# Patient Record
Sex: Male | Born: 1970
Health system: Southern US, Community
[De-identification: ages and names within clinical notes are randomized; demographics above are authoritative.]

## PROBLEM LIST (undated history)

## (undated) DIAGNOSIS — R569 Unspecified convulsions: Secondary | ICD-10-CM

## (undated) DIAGNOSIS — E559 Vitamin D deficiency, unspecified: Secondary | ICD-10-CM

## (undated) DIAGNOSIS — E538 Deficiency of other specified B group vitamins: Secondary | ICD-10-CM

## (undated) DIAGNOSIS — N189 Chronic kidney disease, unspecified: Secondary | ICD-10-CM

## (undated) DIAGNOSIS — K219 Gastro-esophageal reflux disease without esophagitis: Secondary | ICD-10-CM

## (undated) DIAGNOSIS — Z87442 Personal history of urinary calculi: Secondary | ICD-10-CM

## (undated) HISTORY — DX: Deficiency of other specified B group vitamins: E53.8

## (undated) HISTORY — DX: Vitamin D deficiency, unspecified: E55.9

## (undated) HISTORY — DX: Unspecified convulsions: R56.9

## (undated) HISTORY — PX: HERNIA REPAIR: SHX51

## (undated) HISTORY — PX: OTHER SURGICAL HISTORY: SHX169

## (undated) HISTORY — DX: Gastro-esophageal reflux disease without esophagitis: K21.9

## (undated) HISTORY — DX: Chronic kidney disease, unspecified: N18.9

---

## 1992-07-15 HISTORY — PX: APPENDECTOMY: SHX54

## 2012-06-23 ENCOUNTER — Encounter: Payer: Self-pay | Admitting: Sports Medicine

## 2012-06-23 ENCOUNTER — Ambulatory Visit (INDEPENDENT_AMBULATORY_CARE_PROVIDER_SITE_OTHER): Payer: BC Managed Care – PPO | Admitting: Sports Medicine

## 2012-06-23 VITALS — BP 149/79 | HR 80

## 2012-06-23 DIAGNOSIS — M7660 Achilles tendinitis, unspecified leg: Secondary | ICD-10-CM

## 2012-06-23 DIAGNOSIS — Q667 Congenital pes cavus, unspecified foot: Secondary | ICD-10-CM | POA: Insufficient documentation

## 2012-06-23 DIAGNOSIS — M216X9 Other acquired deformities of unspecified foot: Secondary | ICD-10-CM

## 2012-06-23 DIAGNOSIS — M7661 Achilles tendinitis, right leg: Secondary | ICD-10-CM | POA: Insufficient documentation

## 2012-06-23 NOTE — Progress Notes (Signed)
Patient ID: Devon Carter, male   DOB: 05-Apr-1971, 41 y.o.   MRN: [id number]  Patient was referred to me courtesy of Launa Flight He works as a Psychologist, occupational at the [employer] He does running for fitness and does recreational basketball He used to play more competitive basketball but still enjoys playing several times a week  Last year he developed a right Achilles tendinitis at the insertion This took about 9 months to resolve He used Dr. Margart Sickles insoles with a lift in his shoes but those are uncomfortable on his arch That did help the Achilles tendon  History a remote fracture to his left ankle This has never had quite as good of motion since that time and he feels he pushes off harder with the right side  Physical examination  No acute distress  Bilateral cavus feet  RT Ankle: No visible erythema or swelling. Range of motion is full in all directions. Strength is 5/5 in all directions. Stable lateral and medial ligaments; squeeze test and kleiger test unremarkable; Talar dome nontender; No pain at base of 5th MT; No tenderness over cuboid; No tenderness over N spot or navicular prominence No tenderness on posterior aspects of lateral and medial malleolus No sign of peroneal tendon subluxations; Negative tarsal tunnel tinel's Able to walk 4 steps.  LT ankle Stable in all testing just as is the right However his motion is decreased by about 10-15% on the left There is some thickening of the ankle capsule on the left  Mild tenderness to direct palpation just at the insertion of the right Achilles but no swelling or redness  Running gait reveals that he hits with high impact with more pressure on his forefoot and overall neutral gait

## 2012-06-23 NOTE — Assessment & Plan Note (Signed)
Patient was fitted for a : standard, cushioned, semi-rigid orthotic. The orthotic was heated and afterward the patient stood on the orthotic blank positioned on the orthotic stand. The patient was positioned in subtalar neutral position and 10 degrees of ankle dorsiflexion in a weight bearing stance. After completion of molding, a stable base was applied to the orthotic blank. The blank was ground to a stable position for weight bearing. Size: 11 red EVA Base: blue EVA Posting: Lt first ray Additional orthotic padding:none Time 43 mins  His running gait was definitely improved in the orthotics and he feels both more cushioned and less pressure on his right Achilles tendon  He should use these for the next several months and we will be happy adjustment if needed   Recheck with me if this does not resolve his symptoms

## 2012-06-23 NOTE — Assessment & Plan Note (Signed)
Given a standard set of Achilles rehabilitation exercises  He needs to give these a good probably do them at least 3 times weekly for the next several months

## 2012-09-11 ENCOUNTER — Ambulatory Visit: Payer: BC Managed Care – PPO | Admitting: Family Medicine

## 2012-09-22 ENCOUNTER — Encounter: Payer: Self-pay | Admitting: Family Medicine

## 2012-09-22 ENCOUNTER — Ambulatory Visit (INDEPENDENT_AMBULATORY_CARE_PROVIDER_SITE_OTHER): Payer: BC Managed Care – PPO | Admitting: Family Medicine

## 2012-09-22 VITALS — BP 120/80 | HR 72 | Temp 98.3°F | Resp 12 | Ht 71.0 in | Wt 184.0 lb

## 2012-09-22 DIAGNOSIS — E559 Vitamin D deficiency, unspecified: Secondary | ICD-10-CM | POA: Insufficient documentation

## 2012-09-22 DIAGNOSIS — K219 Gastro-esophageal reflux disease without esophagitis: Secondary | ICD-10-CM | POA: Insufficient documentation

## 2012-09-22 DIAGNOSIS — Z87442 Personal history of urinary calculi: Secondary | ICD-10-CM

## 2012-09-22 DIAGNOSIS — Z8669 Personal history of other diseases of the nervous system and sense organs: Secondary | ICD-10-CM

## 2012-09-22 DIAGNOSIS — E538 Deficiency of other specified B group vitamins: Secondary | ICD-10-CM

## 2012-09-22 DIAGNOSIS — Z0000 Encounter for general adult medical examination without abnormal findings: Secondary | ICD-10-CM

## 2012-09-22 NOTE — Progress Notes (Signed)
  Subjective:    Patient ID: Devon Carter, male    DOB: Jun 01, 1971, 42 y.o.   MRN: [id number]  HPI Patient seen to establish care.  Moved here from [facility] secondary to work Major medical issue is recurrent kidney stones These were calcium stones and he estimates he had about 19. No recent stones. These have generally past with pain medications and increased hydration  History of grand mal seizures age 71. He was treated with Dilantin until freshman in college and stopped this on his own. He has not had any seizures since stopping as many years ago. Other medical problems include history of GERD and he takes regular AcipHex. He had previous endoscopies which have been normal History of low vitamin B12 apparently related to PPI use. Not a vegetarian. No history of gastric surgery History of reported low vitamin D on replacement. Also takes oral vitamin B12 replacement  Family history significant for father having prostate cancer and died of complications of esophageal cancer. Father also reportedly had congestive heart failure.  Patient is married. He has 2 children. Nonsmoker. Occasional alcohol use. Works in Photographer   Review of Systems  Constitutional: Negative for appetite change, fatigue and unexpected weight change.  Eyes: Negative for visual disturbance.  Respiratory: Negative for cough, chest tightness and shortness of breath.   Cardiovascular: Negative for chest pain, palpitations and leg swelling.  Neurological: Negative for dizziness, syncope, weakness, light-headedness and headaches.       Objective:   Physical Exam  Constitutional: He appears well-developed and well-nourished.  Neck: Neck supple. No thyromegaly present.  Cardiovascular: Normal rate and regular rhythm.   Pulmonary/Chest: Effort normal and breath sounds normal. No respiratory distress. He has no wheezes. He has no rales.  Musculoskeletal: He exhibits no edema.          Assessment  & Plan:  #1 history of recurrent kidney stones. Patient has hydrocodone to use as needed. Consider establishing with local urologist if he has ongoing stone issues #2 history of chronic GERD. Stable on AcipHex  #3 history of low vitamin B12. ?secondary to chronic PPI use. Recheck levels with physical which is scheduled #4 history of low vitamin D. Recheck levels at followup #5 remote history of grand mal seizures but no seizure in several years off medication

## 2012-10-08 ENCOUNTER — Other Ambulatory Visit (INDEPENDENT_AMBULATORY_CARE_PROVIDER_SITE_OTHER): Payer: BC Managed Care – PPO

## 2012-10-08 DIAGNOSIS — Z0000 Encounter for general adult medical examination without abnormal findings: Secondary | ICD-10-CM

## 2012-10-08 LAB — BASIC METABOLIC PANEL
GFR: 63.19 mL/min (ref >60.00)
Glucose, Bld: 96 mg/dL (ref 70–99)
Potassium: 5.2 mEq/L — ABNORMAL HIGH (ref 3.5–5.1)
Sodium: 138 mEq/L (ref 135–145)

## 2012-10-08 LAB — CBC WITH DIFFERENTIAL/PLATELET
Eosinophils Absolute: 0.2 10*3/uL (ref 0.0–0.7)
MCHC: 34.1 g/dL (ref 30.0–36.0)
MCV: 90.8 fl (ref 78.0–100.0)
Monocytes Absolute: 0.7 10*3/uL (ref 0.1–1.0)
Neutrophils Relative %: 67.3 % (ref 43.0–77.0)
Platelets: 173 10*3/uL (ref 150.0–400.0)

## 2012-10-08 LAB — LIPID PANEL
Cholesterol: 177 mg/dL (ref 0–200)
HDL: 46.8 mg/dL (ref >39.00)
Triglycerides: 67 mg/dL (ref 0.0–149.0)
VLDL: 13.4 mg/dL (ref 0.0–40.0)

## 2012-10-08 LAB — VITAMIN B12: Vitamin B-12: 361 pg/mL (ref 211–911)

## 2012-10-08 LAB — HEPATIC FUNCTION PANEL
ALT: 21 U/L (ref 0–53)
Albumin: 4.5 g/dL (ref 3.5–5.2)
Total Bilirubin: 1.2 mg/dL (ref 0.3–1.2)

## 2012-10-08 LAB — TSH: TSH: 1.78 u[IU]/mL (ref 0.35–5.50)

## 2012-10-09 ENCOUNTER — Other Ambulatory Visit: Payer: BC Managed Care – PPO

## 2012-10-09 LAB — VITAMIN D 25 HYDROXY (VIT D DEFICIENCY, FRACTURES): Vit D, 25-Hydroxy: 25 ng/mL — ABNORMAL LOW (ref 30–89)

## 2012-10-13 ENCOUNTER — Other Ambulatory Visit: Payer: BC Managed Care – PPO

## 2012-10-14 ENCOUNTER — Other Ambulatory Visit: Payer: Self-pay | Admitting: *Deleted

## 2012-10-14 MED ORDER — CHOLECALCIFEROL 25 MCG (1000 UT) PO TABS: 1000.0000 [IU] | ORAL_TABLET | Freq: Every day | ORAL | Status: DC

## 2012-10-14 NOTE — Progress Notes (Signed)
Quick Note:  Pt informed ______ 

## 2012-10-21 ENCOUNTER — Ambulatory Visit (INDEPENDENT_AMBULATORY_CARE_PROVIDER_SITE_OTHER): Payer: BC Managed Care – PPO | Admitting: Family Medicine

## 2012-10-21 ENCOUNTER — Encounter: Payer: Self-pay | Admitting: Family Medicine

## 2012-10-21 VITALS — BP 120/80 | HR 72 | Temp 98.4°F | Resp 12 | Ht 71.25 in | Wt 186.0 lb

## 2012-10-21 DIAGNOSIS — Z23 Encounter for immunization: Secondary | ICD-10-CM

## 2012-10-21 DIAGNOSIS — Z0000 Encounter for general adult medical examination without abnormal findings: Secondary | ICD-10-CM

## 2012-10-21 LAB — POCT URINALYSIS DIPSTICK
Protein, UA: NEGATIVE
Spec Grav, UA: >=1.03
Urobilinogen, UA: 0.2

## 2012-10-21 NOTE — Progress Notes (Signed)
Subjective:    Patient ID: Devon Carter, male    DOB: October 06, 1970, 42 y.o.   MRN: [id number]  HPI Seen for complete physical He has history of vitamin D deficiency, GERD, kidney stones, remote history of seizures, and B12 deficiency. B12 deficiency likely related to chronic PPI use. No history of any gastric surgery. Nonsmoker. Exercises regularly. Last tetanus unknown but estimated 10 years  Acute issue of right upper thoracic back pain. No injury. Described as soreness. Worse with movement. Started Robaxin left over from prior prescription and ibuprofen and this has helped. Denies any upper extremity weakness or numbness.  Past Medical History  Diagnosis Date  . Chronic kidney disease     kidney stones  . Vitamin B 12 deficiency   . Vitamin D deficiency disease   . GERD (gastroesophageal reflux disease)   . Seizures     grand mal, age 23  No medication since age 16 and no recurrent seizure.   Past Surgical History  Procedure Laterality Date  . Appendectomy  1994  . Hernia repair  2002, 2010    reports that he has never smoked. He has never used smokeless tobacco. His alcohol and drug histories are not on file. family history includes Arthritis in his mother; Cancer in his father; and Heart disease in his father. Allergies  Allergen Reactions  . Percocet (Oxycodone-Acetaminophen) Nausea Only  . Celebrex (Celecoxib) Rash      Review of Systems  Constitutional: Negative for fever, activity change, appetite change, fatigue and unexpected weight change.  HENT: Negative for ear pain, congestion and trouble swallowing.   Eyes: Negative for pain and visual disturbance.  Respiratory: Negative for cough, shortness of breath and wheezing.   Cardiovascular: Negative for chest pain and palpitations.  Gastrointestinal: Negative for nausea, vomiting, abdominal pain, diarrhea, constipation, blood in stool, abdominal distention and rectal pain.  Genitourinary: Negative for dysuria,  hematuria and testicular pain.  Musculoskeletal: Positive for back pain. Negative for joint swelling and arthralgias.  Skin: Negative for rash.  Neurological: Negative for dizziness, syncope and headaches.  Hematological: Negative for adenopathy.  Psychiatric/Behavioral: Negative for confusion and dysphoric mood.       Objective:   Physical Exam  Constitutional: He is oriented to person, place, and time. He appears well-developed and well-nourished. No distress.  HENT:  Head: Normocephalic and atraumatic.  Right Ear: External ear normal.  Left Ear: External ear normal.  Mouth/Throat: Oropharynx is clear and moist.  Eyes: Conjunctivae and EOM are normal. Pupils are equal, round, and reactive to light.  Neck: Normal range of motion. Neck supple. No thyromegaly present.  Cardiovascular: Normal rate, regular rhythm and normal heart sounds.   No murmur heard. Pulmonary/Chest: No respiratory distress. He has no wheezes. He has no rales.  Abdominal: Soft. Bowel sounds are normal. He exhibits no distension and no mass. There is no tenderness. There is no rebound and no guarding.  Musculoskeletal: He exhibits no edema.  He has some localized tenderness right upper thoracic region. No rash  Lymphadenopathy:    He has no cervical adenopathy.  Neurological: He is alert and oriented to person, place, and time. He displays normal reflexes. No cranial nerve deficit.  Skin: No rash noted.  Psychiatric: He has a normal mood and affect.          Assessment & Plan:   Complete physical. Labs reviewed. No significant abnormalities other than low vitamin D level. He has already started over-the-counter replacement. Reassess vitamin D level in 6  months. Tetanus given.   Muscular upper back pain. Try heat, massage, topical rub, muscle relaxer, and ibuprofen. Consider trigger point injection if not improving with the above

## 2012-10-21 NOTE — Patient Instructions (Signed)
Let's plan repeat vitamin D level in 6 months Try moist heat, muscle massage, and topical rubs such as Biofreeze for upper back pain Continue ibuprofen and muscle relaxer as needed. Touch base in one week if no improvement

## 2013-02-15 ENCOUNTER — Encounter: Payer: Self-pay | Admitting: Family Medicine

## 2013-02-15 ENCOUNTER — Ambulatory Visit (INDEPENDENT_AMBULATORY_CARE_PROVIDER_SITE_OTHER): Payer: BC Managed Care – PPO | Admitting: Family Medicine

## 2013-02-15 VITALS — BP 124/80 | HR 65 | Temp 98.6°F | Wt 184.0 lb

## 2013-02-15 DIAGNOSIS — M25549 Pain in joints of unspecified hand: Secondary | ICD-10-CM

## 2013-02-15 DIAGNOSIS — M25541 Pain in joints of right hand: Secondary | ICD-10-CM

## 2013-02-15 MED ORDER — MELOXICAM 15 MG PO TABS: 15.0000 mg | ORAL_TABLET | Freq: Every day | ORAL | Status: DC

## 2013-02-15 NOTE — Patient Instructions (Addendum)
Touch base in 2 weeks if no better. 

## 2013-02-15 NOTE — Progress Notes (Signed)
  Subjective:    Patient ID: Devon Carter, male    DOB: 02/10/1971, 42 y.o.   MRN: [id number]  HPI Acute visit for right hand pain. Patient recalls injury while playing basketball about 5 weeks ago. He is not sure of exact specifics burn members right hand being injured with basketball. Pain is confined to right fifth metacarpophalangeal joint. Possibly some mild edema initially afterwards. No ecchymosis.  Currently has pain with things like handshakes and shooting basketball. Full range of motion fifth digit. Advil improved the soreness. No weakness. No numbness.  Past Medical History  Diagnosis Date  . Chronic kidney disease     kidney stones  . Vitamin B 12 deficiency   . Vitamin D deficiency disease   . GERD (gastroesophageal reflux disease)   . Seizures     grand mal, age 88  No medication since age 42 and no recurrent seizure.   Past Surgical History  Procedure Laterality Date  . Appendectomy  1994  . Hernia repair  2002, 2010    reports that he has never smoked. He has never used smokeless tobacco. His alcohol and drug histories are not on file. family history includes Arthritis in his mother; Cancer in his father; and Heart disease in his father. Allergies  Allergen Reactions  . Percocet (Oxycodone-Acetaminophen) Nausea Only  . Celebrex (Celecoxib) Rash       Review of Systems  Neurological: Negative for weakness and numbness.       Objective:   Physical Exam  Constitutional: He appears well-developed and well-nourished.  Cardiovascular: Normal rate and regular rhythm.   Musculoskeletal:  Right hand reveals no obvious edema. He has some mild tenderness in palpating over the right fifth metacarpophalangeal joint. He has full range of motion all joints of the hand. He has pain with abduction and abduction of the fifth digit against resistance. No metacarpal tenderness          Assessment & Plan:  Pain involving right fifth metacarpophalangeal  joint-question ligament strain. He has no evidence for any edema or ecchymosis at this time. Trial of meloxicam 15 mg once daily.  Call if no better in 2 weeks. Consider referral to hand specialist if no improvement at that time.

## 2013-03-22 ENCOUNTER — Telehealth: Payer: Self-pay | Admitting: Family Medicine

## 2013-03-22 MED ORDER — RABEPRAZOLE SODIUM 20 MG PO TBEC: 20.0000 mg | DELAYED_RELEASE_TABLET | Freq: Every day | ORAL | Status: DC

## 2013-03-22 NOTE — Telephone Encounter (Signed)
Patient requesting 90-day refill on ACIPHEX 20 MG tablet once daily be sent to [facility] on [address]. Last OV 8/4. This will be first time Dr. Caryl Never has filled this rx for him. Please advise.

## 2013-03-22 NOTE — Telephone Encounter (Signed)
Rx sent to pharmacy   

## 2013-04-13 ENCOUNTER — Encounter: Payer: Self-pay | Admitting: Family Medicine

## 2013-04-13 ENCOUNTER — Ambulatory Visit (INDEPENDENT_AMBULATORY_CARE_PROVIDER_SITE_OTHER): Payer: BC Managed Care – PPO | Admitting: Family Medicine

## 2013-04-13 VITALS — BP 120/68 | HR 67 | Temp 98.0°F | Wt 185.0 lb

## 2013-04-13 DIAGNOSIS — K644 Residual hemorrhoidal skin tags: Secondary | ICD-10-CM

## 2013-04-13 MED ORDER — HYDROCORTISONE ACE-PRAMOXINE 1-1 % RE FOAM: 1.0000 | Freq: Two times a day (BID) | RECTAL | Status: DC

## 2013-04-13 NOTE — Patient Instructions (Signed)
Hemorrhoids Hemorrhoids are swollen veins around the rectum or anus. There are two types of hemorrhoids:   Internal hemorrhoids. These occur in the veins just inside the rectum. They may poke through to the outside and become irritated and painful.  External hemorrhoids. These occur in the veins outside the anus and can be felt as a painful swelling or hard lump near the anus. CAUSES  Pregnancy.   Obesity.   Constipation or diarrhea.   Straining to have a bowel movement.   Sitting for long periods on the toilet.  Heavy lifting or other activity that caused you to strain.  Anal intercourse. SYMPTOMS   Pain.   Anal itching or irritation.   Rectal bleeding.   Fecal leakage.   Anal swelling.   One or more lumps around the anus.  DIAGNOSIS  Your caregiver may be able to diagnose hemorrhoids by visual examination. Other examinations or tests that may be performed include:   Examination of the rectal area with a gloved hand (digital rectal exam).   Examination of anal canal using a small tube (scope).   A blood test if you have lost a significant amount of blood.  A test to look inside the colon (sigmoidoscopy or colonoscopy). TREATMENT Most hemorrhoids can be treated at home. However, if symptoms do not seem to be getting better or if you have a lot of rectal bleeding, your caregiver may perform a procedure to help make the hemorrhoids get smaller or remove them completely. Possible treatments include:   Placing a rubber band at the base of the hemorrhoid to cut off the circulation (rubber band ligation).   Injecting a chemical to shrink the hemorrhoid (sclerotherapy).   Using a tool to burn the hemorrhoid (infrared light therapy).   Surgically removing the hemorrhoid (hemorrhoidectomy).   Stapling the hemorrhoid to block blood flow to the tissue (hemorrhoid stapling).  HOME CARE INSTRUCTIONS   Eat foods with fiber, such as whole grains, beans,  nuts, fruits, and vegetables. Ask your doctor about taking products with added fiber in them (fibersupplements).  Increase fluid intake. Drink enough water and fluids to keep your urine clear or pale yellow.   Exercise regularly.   Go to the bathroom when you have the urge to have a bowel movement. Do not wait.   Avoid straining to have bowel movements.   Keep the anal area dry and clean. Use wet toilet paper or moist towelettes after a bowel movement.   Medicated creams and suppositories may be used or applied as directed.   Only take over-the-counter or prescription medicines as directed by your caregiver.   Take warm sitz baths for 15 20 minutes, 3 4 times a day to ease pain and discomfort.   Place ice packs on the hemorrhoids if they are tender and swollen. Using ice packs between sitz baths may be helpful.   Put ice in a plastic bag.   Place a towel between your skin and the bag.   Leave the ice on for 15 20 minutes, 3 4 times a day.   Do not use a donut-shaped pillow or sit on the toilet for long periods. This increases blood pooling and pain.  SEEK MEDICAL CARE IF:  You have increasing pain and swelling that is not controlled by treatment or medicine.  You have uncontrolled bleeding.  You have difficulty or you are unable to have a bowel movement.  You have pain or inflammation outside the area of the hemorrhoids. MAKE SURE YOU:    Understand these instructions.  Will watch your condition.  Will get help right away if you are not doing well or get worse. Document Released: 06/28/2000 Document Revised: 06/17/2012 Document Reviewed: 05/05/2012 [employer] Patient Information 2014 [employer].  

## 2013-04-13 NOTE — Progress Notes (Signed)
  Subjective:    Patient ID: Devon Carter, male    DOB: [birth year], 42 y.o.   MRN: [id number]  HPI Patient here for slightly painful hemorrhoid. First noted on Saturday. Noted after some constipation and straining. He has not noted any bleeding. This is more of a stinging discomfort and relatively mild. Use preparation H. with mild improvement. No prior history of hemorrhoids.  Past Medical History  Diagnosis Date  . Chronic kidney disease     kidney stones  . Vitamin B 12 deficiency   . Vitamin D deficiency disease   . GERD (gastroesophageal reflux disease)   . Seizures     grand mal, age 45  No medication since age 25 and no recurrent seizure.   Past Surgical History  Procedure Laterality Date  . Appendectomy  1994  . Hernia repair  2002, 2010    reports that he has never smoked. He has never used smokeless tobacco. His alcohol and drug histories are not on file. family history includes Arthritis in his mother; Cancer in his father; Heart disease in his father. Allergies  Allergen Reactions  . Percocet [Oxycodone-Acetaminophen] Nausea Only  . Celebrex [Celecoxib] Rash      Review of Systems  Gastrointestinal: Positive for constipation. Negative for diarrhea and blood in stool.       Objective:   Physical Exam  Constitutional: He appears well-developed and well-nourished.  Cardiovascular: Normal rate and regular rhythm.   Pulmonary/Chest: Effort normal and breath sounds normal. No respiratory distress (and). He has no wheezes. He has no rales.  Genitourinary:  Patient has inflamed external hemorrhoid 2:00 position. No acute thrombosis. This has a fairly wide base          Assessment & Plan:  External hemorrhoid. We discussed prevention and avoidance of constipation. Recommend sitz baths. Consider stool softener as necessary. ProctoFoam-HC 2-3 times daily as needed

## 2013-05-20 ENCOUNTER — Other Ambulatory Visit: Payer: Self-pay

## 2013-09-14 ENCOUNTER — Encounter: Payer: Self-pay | Admitting: Family Medicine

## 2013-09-15 ENCOUNTER — Other Ambulatory Visit: Payer: Self-pay

## 2013-09-15 MED ORDER — RABEPRAZOLE SODIUM 20 MG PO TBEC: 20.0000 mg | DELAYED_RELEASE_TABLET | Freq: Every day | ORAL | Status: DC

## 2013-10-20 ENCOUNTER — Encounter: Payer: BC Managed Care – PPO | Admitting: Family Medicine

## 2013-10-20 ENCOUNTER — Encounter: Payer: Self-pay | Admitting: Family Medicine

## 2013-10-20 ENCOUNTER — Ambulatory Visit (INDEPENDENT_AMBULATORY_CARE_PROVIDER_SITE_OTHER): Payer: BC Managed Care – PPO | Admitting: Family Medicine

## 2013-10-20 VITALS — BP 120/72 | HR 60 | Temp 98.3°F | Wt 180.0 lb

## 2013-10-20 DIAGNOSIS — E559 Vitamin D deficiency, unspecified: Secondary | ICD-10-CM

## 2013-10-20 DIAGNOSIS — R071 Chest pain on breathing: Secondary | ICD-10-CM

## 2013-10-20 DIAGNOSIS — R0789 Other chest pain: Secondary | ICD-10-CM

## 2013-10-20 DIAGNOSIS — Z87442 Personal history of urinary calculi: Secondary | ICD-10-CM

## 2013-10-20 MED ORDER — IBUPROFEN 800 MG PO TABS: 800.0000 mg | ORAL_TABLET | Freq: Three times a day (TID) | ORAL | Status: DC | PRN

## 2013-10-20 NOTE — Progress Notes (Signed)
   Subjective:    Patient ID: Devon Carter, male    DOB: Jan 25, 1971, 43 y.o.   MRN: [id number]  HPI Patient seen with chest wall soreness. Last Thursday playing basketball and elbow just left of sternum. No loss of breath. Increased pain the next day. He is taking ibuprofen 400 mg occasionally which helps somewhat. Denies any cough. No hemoptysis. No dyspnea.  History of low vitamin D. Takes 1000 international units once daily. Requesting repeat levels.  Patient has history of recurrent kidney stones. He states these were analyzed and calcium stones in the past. He states he has had 14 stones previously. He's recently had some intermittent left flank pain but no hematuria and no fever o r dysuria. No abdominal pain. Requesting urology referral.  Past Medical History  Diagnosis Date  . Chronic kidney disease     kidney stones  . Vitamin B 12 deficiency   . Vitamin D deficiency disease   . GERD (gastroesophageal reflux disease)   . Seizures     grand mal, age 43  No medication since age 43 and no recurrent seizure.   Past Surgical History  Procedure Laterality Date  . Appendectomy  1994  . Hernia repair  2002, 2010    reports that he has never smoked. He has never used smokeless tobacco. His alcohol and drug histories are not on file. family history includes Arthritis in his mother; Cancer in his father; Heart disease in his father. Allergies  Allergen Reactions  . Percocet [Oxycodone-Acetaminophen] Nausea Only  . Celebrex [Celecoxib] Rash      Review of Systems  Cardiovascular: Negative for palpitations and leg swelling.  Gastrointestinal: Negative for nausea, vomiting and abdominal pain.  Endocrine: Negative for polydipsia and polyuria.  Genitourinary: Negative for hematuria.  Neurological: Negative for dizziness.       Objective:   Physical Exam  Constitutional: He appears well-developed and well-nourished.  Cardiovascular: Normal rate.  Exam reveals no gallop and no  friction rub.   No murmur heard. Pulmonary/Chest: Effort normal. No respiratory distress. He has no wheezes. He has no rales. He exhibits no tenderness.  Chest wall reveals some minimal bruising just below the left pectoral region. He has some tenderness in palpating the third and fourth anterior ribs.  Abdominal: Soft. There is no tenderness.          Assessment & Plan:  #1 left chest wall contusion. Motrin 800 mg every 8 hours as needed #2 history of recurrent kidney stones. Set up urology referral per patient request. He has no acute symptoms #3 history of vitamin D deficiency. Check 25-hydroxy vitamin D level

## 2013-10-20 NOTE — Progress Notes (Signed)
Pre visit review using our clinic review tool, if applicable. No additional management support is needed unless otherwise documented below in the visit note. 

## 2013-10-21 ENCOUNTER — Encounter: Payer: Self-pay | Admitting: Family Medicine

## 2013-10-21 LAB — VITAMIN D 25 HYDROXY (VIT D DEFICIENCY, FRACTURES): VIT D 25 HYDROXY: 34 ng/mL (ref 30–89)

## 2014-04-06 ENCOUNTER — Other Ambulatory Visit: Payer: Self-pay | Admitting: Family Medicine

## 2014-08-30 ENCOUNTER — Ambulatory Visit (INDEPENDENT_AMBULATORY_CARE_PROVIDER_SITE_OTHER): Payer: BLUE CROSS/BLUE SHIELD | Admitting: Family Medicine

## 2014-08-30 ENCOUNTER — Encounter: Payer: Self-pay | Admitting: Family Medicine

## 2014-08-30 VITALS — BP 118/78 | HR 68 | Temp 97.6°F | Wt 180.0 lb

## 2014-08-30 DIAGNOSIS — R197 Diarrhea, unspecified: Secondary | ICD-10-CM

## 2014-08-30 DIAGNOSIS — R300 Dysuria: Secondary | ICD-10-CM

## 2014-08-30 DIAGNOSIS — N200 Calculus of kidney: Secondary | ICD-10-CM

## 2014-08-30 LAB — POCT URINALYSIS DIPSTICK
BILIRUBIN UA: NEGATIVE
GLUCOSE UA: NEGATIVE
Ketones, UA: NEGATIVE
Leukocytes, UA: NEGATIVE
Nitrite, UA: NEGATIVE
Protein, UA: NEGATIVE
SPEC GRAV UA: 1.01
Urobilinogen, UA: 0.2
pH, UA: 6

## 2014-08-30 LAB — URINALYSIS, MICROSCOPIC ONLY

## 2014-08-30 MED ORDER — HYDROCODONE-ACETAMINOPHEN 10-325 MG PO TABS: 1.0000 | ORAL_TABLET | Freq: Four times a day (QID) | ORAL | Status: DC | PRN

## 2014-08-30 MED ORDER — ONDANSETRON 8 MG PO TBDP: 8.0000 mg | ORAL_TABLET | Freq: Three times a day (TID) | ORAL | Status: DC | PRN

## 2014-08-30 MED ORDER — KETOROLAC TROMETHAMINE 60 MG/2ML IM SOLN
60.0000 mg | Freq: Once | INTRAMUSCULAR | Status: AC
  Administered 2014-08-30: 60 mg via INTRAMUSCULAR

## 2014-08-30 NOTE — Progress Notes (Signed)
   Subjective:    Patient ID: Devon Carter, male    DOB: [birth year], 44 y.o.   MRN: [id number]  HPI Acute visit for the following issues  Diarrhea past couple days. He asked had some loose stools per week and half ago they seem to be improving but over the weekend developed recurrence of diarrhea which varies from loose to watery. No nausea or vomiting. Some diffuse abdominal cramping. Some increased lethargy over the weekend. Travels for work but no international travel. No recent antibiotics. No recent appetite or weight changes.  He has separate issue of just this morning developed some acute left lumbar back pain. History of kidney stones (about 12 in the past) and states this pain is very similar. He's had some waxing and waning pain over the morning and started having increasing pain here in the office even as we were getting his history. Currently 6-7 out of 10. He has taken Toradol injections the past which of help. He also has Flomax at home per urology.  Past Medical History  Diagnosis Date  . Chronic kidney disease     kidney stones  . Vitamin B 12 deficiency   . Vitamin D deficiency disease   . GERD (gastroesophageal reflux disease)   . Seizures     grand mal, age 44  No medication since age 44 and no recurrent seizure.   Past Surgical History  Procedure Laterality Date  . Appendectomy  1994  . Hernia repair  2002, 2010    reports that he has never smoked. He has never used smokeless tobacco. His alcohol and drug histories are not on file. family history includes Arthritis in his mother; Cancer in his father; Heart disease in his father. Allergies  Allergen Reactions  . Percocet [Oxycodone-Acetaminophen] Nausea Only  . Celebrex [Celecoxib] Rash      Review of Systems  Respiratory: Negative for cough.   Gastrointestinal: Positive for abdominal pain and diarrhea. Negative for nausea, vomiting, constipation, blood in stool and abdominal distention.  Genitourinary:  Positive for flank pain. Negative for hematuria.  Musculoskeletal: Positive for back pain.       Objective:   Physical Exam  Constitutional: He appears well-developed and well-nourished.  HENT:  Mouth/Throat: Oropharynx is clear and moist.  Cardiovascular: Normal rate and regular rhythm.   Pulmonary/Chest: Effort normal and breath sounds normal. No respiratory distress. He has no wheezes. He has no rales.  Abdominal: Soft. Bowel sounds are normal. He exhibits no distension and no mass. There is no rebound and no guarding.  Minimal tenderness left lower quadrant to deep palpation. No guarding or rebound          Assessment & Plan:  #1 diarrhea. Question viral. Does not appear dehydrated. Dietary measures discussed.  Touch base if not resolving over next week. #2 probable recurrent kidney stone. Clinically his symptoms fit and urinalysis reveals 1+ blood with no nitrites or leukocytes. Toradol 60 mg IM given. Start back Flomax. Refill hydrocodone 10 mg 1 every 6 hours for severe pain. Prompt follow-up with urology if not resolving the next few days

## 2014-08-30 NOTE — Patient Instructions (Addendum)
Diarrhea Diarrhea is frequent loose and watery bowel movements. It can cause you to feel weak and dehydrated. Dehydration can cause you to become tired and thirsty, have a dry mouth, and have decreased urination that often is dark yellow. Diarrhea is a sign of another problem, most often an infection that will not last long. In most cases, diarrhea typically lasts 2-3 days. However, it can last longer if it is a sign of something more serious. It is important to treat your diarrhea as directed by your caregiver to lessen or prevent future episodes of diarrhea. CAUSES  Some common causes include:  Gastrointestinal infections caused by viruses, bacteria, or parasites.  Food poisoning or food allergies.  Certain medicines, such as antibiotics, chemotherapy, and laxatives.  Artificial sweeteners and fructose.  Digestive disorders. HOME CARE INSTRUCTIONS  Ensure adequate fluid intake (hydration): Have 1 cup (8 oz) of fluid for each diarrhea episode. Avoid fluids that contain simple sugars or sports drinks, fruit juices, whole milk products, and sodas. Your urine should be clear or pale yellow if you are drinking enough fluids. Hydrate with an oral rehydration solution that you can purchase at pharmacies, retail stores, and online. You can prepare an oral rehydration solution at home by mixing the following ingredients together:   - tsp table salt.   tsp baking soda.   tsp salt substitute containing potassium chloride.  1  tablespoons sugar.  1 L (34 oz) of water.  Certain foods and beverages may increase the speed at which food moves through the gastrointestinal (GI) tract. These foods and beverages should be avoided and include:  Caffeinated and alcoholic beverages.  High-fiber foods, such as raw fruits and vegetables, nuts, seeds, and whole grain breads and cereals.  Foods and beverages sweetened with sugar alcohols, such as xylitol, sorbitol, and mannitol.  Some foods may be well  tolerated and may help thicken stool including:  Starchy foods, such as rice, toast, pasta, low-sugar cereal, oatmeal, grits, baked potatoes, crackers, and bagels.  Bananas.  Applesauce.  Add probiotic-rich foods to help increase healthy bacteria in the GI tract, such as yogurt and fermented milk products.  Wash your hands well after each diarrhea episode.  Only take over-the-counter or prescription medicines as directed by your caregiver.  Take a warm bath to relieve any burning or pain from frequent diarrhea episodes. SEEK IMMEDIATE MEDICAL CARE IF:   You are unable to keep fluids down.  You have persistent vomiting.  You have blood in your stool, or your stools are black and tarry.  You do not urinate in 6-8 hours, or there is only a small amount of very dark urine.  You have abdominal pain that increases or localizes.  You have weakness, dizziness, confusion, or light-headedness.  You have a severe headache.  Your diarrhea gets worse or does not get better.  You have a fever or persistent symptoms for more than 2-3 days.  You have a fever and your symptoms suddenly get worse. MAKE SURE YOU:   Understand these instructions.  Will watch your condition.  Will get help right away if you are not doing well or get worse. Document Released: 06/21/2002 Document Revised: 11/15/2013 Document Reviewed: 03/08/2012 [facility] Patient Information 2015 [place], [place]. This information is not intended to replace advice given to you by your health care provider. Make sure you discuss any questions you have with your health care provider. Kidney Stones Kidney stones (urolithiasis) are deposits that form inside your kidneys. The intense pain is caused  by the stone moving through the urinary tract. When the stone moves, the ureter goes into spasm around the stone. The stone is usually passed in the urine.  CAUSES   A disorder that makes certain neck glands produce too much parathyroid  hormone (primary hyperparathyroidism).  A buildup of uric acid crystals, similar to gout in your joints.  Narrowing (stricture) of the ureter.  A kidney obstruction present at birth (congenital obstruction).  Previous surgery on the kidney or ureters.  Numerous kidney infections. SYMPTOMS   Feeling sick to your stomach (nauseous).  Throwing up (vomiting).  Blood in the urine (hematuria).  Pain that usually spreads (radiates) to the groin.  Frequency or urgency of urination. DIAGNOSIS   Taking a history and physical exam.  Blood or urine tests.  CT scan.  Occasionally, an examination of the inside of the urinary bladder (cystoscopy) is performed. TREATMENT   Observation.  Increasing your fluid intake.  Extracorporeal shock wave lithotripsy--This is a noninvasive procedure that uses shock waves to break up kidney stones.  Surgery may be needed if you have severe pain or persistent obstruction. There are various surgical procedures. Most of the procedures are performed with the use of small instruments. Only small incisions are needed to accommodate these instruments, so recovery time is minimized. The size, location, and chemical composition are all important variables that will determine the proper choice of action for you. Talk to your health care provider to better understand your situation so that you will minimize the risk of injury to yourself and your kidney.  HOME CARE INSTRUCTIONS   Drink enough water and fluids to keep your urine clear or pale yellow. This will help you to pass the stone or stone fragments.  Strain all urine through the provided strainer. Keep all particulate matter and stones for your health care provider to see. The stone causing the pain may be as small as a grain of salt. It is very important to use the strainer each and every time you pass your urine. The collection of your stone will allow your health care provider to analyze it and verify  that a stone has actually passed. The stone analysis will often identify what you can do to reduce the incidence of recurrences.  Only take over-the-counter or prescription medicines for pain, discomfort, or fever as directed by your health care provider.  Make a follow-up appointment with your health care provider as directed.  Get follow-up X-rays if required. The absence of pain does not always mean that the stone has passed. It may have only stopped moving. If the urine remains completely obstructed, it can cause loss of kidney function or even complete destruction of the kidney. It is your responsibility to make sure X-rays and follow-ups are completed. Ultrasounds of the kidney can show blockages and the status of the kidney. Ultrasounds are not associated with any radiation and can be performed easily in a matter of minutes. SEEK MEDICAL CARE IF:  You experience pain that is progressive and unresponsive to any pain medicine you have been prescribed. SEEK IMMEDIATE MEDICAL CARE IF:   Pain cannot be controlled with the prescribed medicine.  You have a fever or shaking chills.  The severity or intensity of pain increases over 18 hours and is not relieved by pain medicine.  You develop a new onset of abdominal pain.  You feel faint or pass out.  You are unable to urinate. MAKE SURE YOU:   Understand these instructions.  Will  watch your condition.  Will get help right away if you are not doing well or get worse. Document Released: 07/01/2005 Document Revised: 03/03/2013 Document Reviewed: 12/02/2012 [facility] Patient Information 2015 [place], [place]. This information is not intended to replace advice given to you by your health care provider. Make sure you discuss any questions you have with your health care provider.  Start back Flomax 0.4 mg once daily Follow-up with urology if pain persist or worsens Be in touch if diarrhea not resolving over the next 3 or 4 days

## 2014-08-30 NOTE — Progress Notes (Signed)
Pre visit review using our clinic review tool, if applicable. No additional management support is needed unless otherwise documented below in the visit note. 

## 2015-01-03 ENCOUNTER — Other Ambulatory Visit: Payer: Self-pay | Admitting: Cardiovascular Disease

## 2015-01-03 MED ORDER — METHYLPREDNISOLONE 4 MG PO TBPK
ORAL_TABLET | ORAL | Status: DC
Start: 2015-01-03 — End: 2016-09-04

## 2015-01-03 NOTE — Progress Notes (Signed)
Devon Carter has had a serious dermatitis from poison ivy. He has tried calamine lotion and cortisone cream. Has weeping from his right  wrist and legs   Will try a medrol dose pack.      Will check in with him in a week .     Nahser, Deloris Ping, MD  01/03/2015 4:05 PM    [facility] [address] [place],  [address] [place], [place]  [address] Pager [phone] Phone: [phone]; Fax: [phone]   [facility]  [address] [place], [place]  [address] [phone]    Fax [phone]

## 2015-04-24 ENCOUNTER — Other Ambulatory Visit: Payer: Self-pay | Admitting: Cardiovascular Disease

## 2015-04-24 DIAGNOSIS — N200 Calculus of kidney: Secondary | ICD-10-CM

## 2015-04-24 MED ORDER — KETOROLAC TROMETHAMINE 10 MG PO TABS
10.0000 mg | ORAL_TABLET | Freq: Four times a day (QID) | ORAL | Status: DC | PRN
Start: 2015-04-24 — End: 2017-08-06

## 2015-04-24 NOTE — Progress Notes (Signed)
[name] has a hx of kidney stones and has another stone  Will try Toradol ( Ketorlac)  20 mg x 1, then 10 mg Q 4-6 hours  Maxi dose 40 mg a day  #20    Vasiliy Mccarry, Deloris Ping, MD  04/24/2015 8:21 AM    [facility] [address] [place],  [address] [place], [place]  [address] Pager [phone] Phone: [phone]; Fax: [phone]   [facility]  [address] [place], [place]  [address] [phone]   Fax [phone]

## 2015-09-08 ENCOUNTER — Ambulatory Visit (INDEPENDENT_AMBULATORY_CARE_PROVIDER_SITE_OTHER): Payer: BLUE CROSS/BLUE SHIELD | Admitting: Gastroenterology

## 2015-09-08 ENCOUNTER — Encounter: Payer: Self-pay | Admitting: Gastroenterology

## 2015-09-08 VITALS — BP 110/80 | HR 68 | Ht 71.25 in | Wt 184.6 lb

## 2015-09-08 DIAGNOSIS — K219 Gastro-esophageal reflux disease without esophagitis: Secondary | ICD-10-CM

## 2015-09-08 DIAGNOSIS — Z800 Family history of malignant neoplasm of digestive organs: Secondary | ICD-10-CM | POA: Diagnosis not present

## 2015-09-08 NOTE — Progress Notes (Signed)
HPI: This is a    Very pleasant 45 year old man     who was referred to me by [facility]   With chronic GERD,  Family history of esophageal cancer .    Chief complaint is  Chronic GERD, family history of esophageal cancer  Has had bloating, digestive issues.  He is not on PPI now.  He has had pyrosis for many years, PPIs or various types for many many years.  He has had at least 3 EGDs  His dad died of esophagus cancer about 6 years ago.   His father's uncle also died of esophageal cancer  He has fatigue in general.  He has pyrosis (late at night, big meals etc).  Occurs 2 times per night, he can often predict these times.  Takes gaviscon on PRN basis.  The gaviscon will usually help.    Review of systems: Pertinent positive and negative review of systems were noted in the above HPI section. Complete review of systems was performed and was otherwise normal.   Past Medical History  Diagnosis Date  . Chronic kidney disease     kidney stones  . Vitamin B 12 deficiency   . Vitamin D deficiency disease   . GERD (gastroesophageal reflux disease)   . Seizures (HCC)     grand mal, age 47  No medication since age 7 and no recurrent seizure.    Past Surgical History  Procedure Laterality Date  . Appendectomy  1994  . Hernia repair  2002, 2010    Current Outpatient Prescriptions  Medication Sig Dispense Refill  . Cholecalciferol (VITAMIN D3) 1000 UNITS CAPS Take 1,000 capsules by mouth daily. Reported on 09/08/2015    . HYDROcodone-acetaminophen (NORCO) 10-325 MG per tablet Take 1 tablet by mouth every 6 (six) hours as needed. Pt uses for kidney stone pain 30 tablet 0  . ibuprofen (ADVIL,MOTRIN) 800 MG tablet Take 1 tablet (800 mg total) by mouth every 8 (eight) hours as needed. 60 tablet 1  . ketorolac (TORADOL) 10 MG tablet Take 1 tablet (10 mg total) by mouth every 6 (six) hours as needed. 20 tablet 0  . Lactobacillus (ACIDOPHILUS PROBIOTIC) 10  MG TABS Take 2 tablets by mouth daily.    . methocarbamol (ROBAXIN) 500 MG tablet Take 500 mg by mouth 4 (four) times daily. Reported on 09/08/2015    . Nystatin 500000 units CAPS Take 2 capsules by mouth daily.    . ondansetron (ZOFRAN ODT) 8 MG disintegrating tablet Take 1 tablet (8 mg total) by mouth every 8 (eight) hours as needed for nausea or vomiting. 15 tablet 0  . methylPREDNISolone (MEDROL DOSEPAK) 4 MG TBPK tablet As directed. (Patient not taking: Reported on 09/08/2015) 21 tablet 1   No current facility-administered medications for this visit.    Allergies as of 09/08/2015 - Review Complete 09/08/2015  Allergen Reaction Noted  . Percocet [oxycodone-acetaminophen] Nausea Only 09/22/2012  . Celebrex [celecoxib] Rash 06/23/2012    Family History  Problem Relation Age of Onset  . Arthritis Mother   . Cancer Father     prostate, esophygeal  . Heart disease Father     CHF ? etiology    Social History   Social History  . Marital Status: Married    Spouse Name: N/A  . Number of Children: N/A  . Years of Education: N/A   Occupational History  . Not on file.   Social History Main Topics  . Smoking status:  Never Smoker   . Smokeless tobacco: Never Used  . Alcohol Use: Not on file  . Drug Use: Not on file  . Sexual Activity: Not on file   Other Topics Concern  . Not on file   Social History Narrative     Physical Exam: BP 110/80 mmHg  Pulse 68  Ht 5' 11.25" (1.81 m)  Wt 184 lb 9.6 oz (83.734 kg)  BMI 25.56 kg/m2 Constitutional: generally well-appearing Psychiatric: alert and oriented x3 Eyes: extraocular movements intact Mouth: oral pharynx moist, no lesions Neck: supple no lymphadenopathy Cardiovascular: heart regular rate and rhythm Lungs: clear to auscultation bilaterally Abdomen: soft, nontender, nondistended, no obvious ascites, no peritoneal signs, normal bowel sounds Extremities: no lower extremity edema bilaterally Skin: no lesions on visible  extremities   Assessment and plan: 45 y.o. male with   Chronic GERD, family history of esophagus cancer   his last upper endoscopy was 5 or 6 years ago and I think it is absolutely reasonable to proceed with a repeat upper endoscopy given his fairly strong family history of esophagus cancer and his intermittent chronic GERD symptoms himself. I did also recommend he consider trying H2 blocker rather than Gaviscon for his overnight acid issues.   Rob Bunting, MD [facility] 09/08/2015, 10:13 AM  Cc: Kristian Covey, MD

## 2015-09-08 NOTE — Patient Instructions (Signed)
You will be set up for an upper endoscopy for chronic GERD, FH of esophagus cancer. Consider ranitidine (75 or ) at bedtime every night for overnight acid symptoms.

## 2015-09-19 ENCOUNTER — Encounter: Payer: Self-pay | Admitting: Gastroenterology

## 2015-09-19 ENCOUNTER — Ambulatory Visit (AMBULATORY_SURGERY_CENTER): Payer: BLUE CROSS/BLUE SHIELD | Admitting: Gastroenterology

## 2015-09-19 VITALS — BP 114/53 | HR 60 | Temp 98.9°F | Resp 28

## 2015-09-19 DIAGNOSIS — K219 Gastro-esophageal reflux disease without esophagitis: Secondary | ICD-10-CM | POA: Diagnosis not present

## 2015-09-19 DIAGNOSIS — Z800 Family history of malignant neoplasm of digestive organs: Secondary | ICD-10-CM | POA: Diagnosis not present

## 2015-09-19 MED ORDER — SODIUM CHLORIDE 0.9 % IV SOLN: 500.0000 mL | INTRAVENOUS | Status: DC

## 2015-09-19 MED ORDER — OMEPRAZOLE 20 MG PO CPDR: 20.0000 mg | DELAYED_RELEASE_CAPSULE | Freq: Every day | ORAL | Status: DC

## 2015-09-19 NOTE — Progress Notes (Signed)
Report to PACU, RN, vss, BBS= Clear.  

## 2015-09-19 NOTE — Op Note (Signed)
[facility] [address] [place], [id number]   ENDOSCOPY PROCEDURE REPORT  PATIENT: Devon Carter, Devon Carter  MR#: [id number] BIRTHDATE: 08/01/1970 , 44  yrs. old GENDER: male ENDOSCOPIST: Rachael Feeaniel P Jacobs, MD REFERRED BY:  Evelena PeatBruce Burchette, M.D. PROCEDURE DATE:  09/19/2015 PROCEDURE:  EGD, diagnostic ASA CLASS:     Class I INDICATIONS:  chronic GERD, FH esophageal cancer (father and uncle).  MEDICATIONS: Monitored anesthesia care and Propofol 200 mg IV TOPICAL ANESTHETIC: none  DESCRIPTION OF PROCEDURE: After the risks benefits and alternatives of the procedure were thoroughly explained, informed consent was obtained.  The LB [id number] endoscope was introduced through the mouth and advanced to the second portion of the duodenum , Without limitations.  The instrument was slowly withdrawn as the mucosa was fully examined.   There was a medium sized hiatal hernia (3-4cm).  There were three short spokes of linear, erosive, reflux esophagitis at the GE junction.  No Barrett's appearing mucosa.  The examination was otherwise normal.  Retroflexed views revealed no abnormalities. The scope was then withdrawn from the patient and the procedure completed. COMPLICATIONS: There were no immediate complications.  ENDOSCOPIC IMPRESSION: There was a medium sized hiatal hernia (3-4cm).  There were three short spokes of linear, erosive, reflux esophagitis at the GE junction.  No Barrett's appearing mucosa.  The examination was otherwise normal  RECOMMENDATIONS: Please start once daily OTC omeprazole 20mg  pill, take one pill shortly before breakfast meal daily.   eSigned:  Rachael Feeaniel P Jacobs, MD 09/19/2015 2:21 PM

## 2015-09-19 NOTE — Patient Instructions (Signed)
YOU HAD AN ENDOSCOPIC PROCEDURE TODAY AT THE [facility]:   Refer to the procedure report that was given to you for any specific questions about what was found during the examination.  If the procedure report does not answer your questions, please call your gastroenterologist to clarify.  If you requested that your care partner not be given the details of your procedure findings, then the procedure report has been included in a sealed envelope for you to review at your convenience later.  YOU SHOULD EXPECT: Some feelings of bloating in the abdomen. Passage of more gas than usual.  Walking can help get rid of the air that was put into your GI tract during the procedure and reduce the bloating. If you had a lower endoscopy (such as a colonoscopy or flexible sigmoidoscopy) you may notice spotting of blood in your stool or on the toilet paper. If you underwent a bowel prep for your procedure, you may not have a normal bowel movement for a few days.  Please Note:  You might notice some irritation and congestion in your nose or some drainage.  This is from the oxygen used during your procedure.  There is no need for concern and it should clear up in a day or so.  SYMPTOMS TO REPORT IMMEDIATELY:     Following upper endoscopy (EGD)  Vomiting of blood or coffee ground material  New chest pain or pain under the shoulder blades  Painful or persistently difficult swallowing  New shortness of breath  Fever of 100F or higher  Black, tarry-looking stools  For urgent or emergent issues, a gastroenterologist can be reached at any hour by calling [phone].   DIET: Your first meal following the procedure should be a small meal and then it is ok to progress to your normal diet. Heavy or fried foods are harder to digest and may make you feel nauseous or bloated.  Likewise, meals heavy in dairy and vegetables can increase bloating.  Drink plenty of fluids but you should avoid alcoholic beverages  for 24 hours.  ACTIVITY:  You should plan to take it easy for the rest of today and you should NOT DRIVE or use heavy machinery until tomorrow (because of the sedation medicines used during the test).    FOLLOW UP: Our staff will call the number listed on your records the next business day following your procedure to check on you and address any questions or concerns that you may have regarding the information given to you following your procedure. If we do not reach you, we will leave a message.  However, if you are feeling well and you are not experiencing any problems, there is no need to return our call.  We will assume that you have returned to your regular daily activities without incident.  If any biopsies were taken you will be contacted by phone or by letter within the next 1-3 weeks.  Please call us at [phone] if you have not heard about the biopsies in 3 weeks.    SIGNATURES/CONFIDENTIALITY: You and/or your care partner have signed paperwork which will be entered into your electronic medical record.  These signatures attest to the fact that that the information above on your After Visit Summary has been reviewed and is understood.  Full responsibility of the confidentiality of this discharge information lies with you and/or your care-partner.   Resume medications. Information given on Hiatal Hernia and Esophagitis.

## 2015-09-20 ENCOUNTER — Telehealth: Payer: Self-pay

## 2015-09-20 NOTE — Telephone Encounter (Signed)
  Follow up Call-  Call back number 09/19/2015  Post procedure Call Back phone  # [phone]  Permission to leave phone message Yes     Patient questions:  Do you have a fever, pain , or abdominal swelling? No. Pain Score  0 *  Have you tolerated food without any problems? Yes.    Have you been able to return to your normal activities? Yes.    Do you have any questions about your discharge instructions: Diet   No. Medications  No. Follow up visit  No.  Do you have questions or concerns about your Care? No.  Actions: * If pain score is 4 or above: No action needed, pain <4.

## 2016-01-26 LAB — CBC AND DIFFERENTIAL
HEMATOCRIT: 51 % (ref 41–53)
HEMOGLOBIN: 17.5 g/dL (ref 13.5–17.5)
Neutrophils Absolute: 2 /uL
Platelets: 191 10*3/uL (ref 150–399)
WBC: 5.3 10*3/mL

## 2016-02-28 ENCOUNTER — Encounter: Payer: Self-pay | Admitting: Family Medicine

## 2016-02-28 ENCOUNTER — Ambulatory Visit (INDEPENDENT_AMBULATORY_CARE_PROVIDER_SITE_OTHER): Payer: BLUE CROSS/BLUE SHIELD | Admitting: Family Medicine

## 2016-02-28 VITALS — BP 100/80 | HR 71 | Temp 98.3°F | Ht 71.25 in | Wt 186.4 lb

## 2016-02-28 DIAGNOSIS — G4484 Primary exertional headache: Secondary | ICD-10-CM

## 2016-02-28 DIAGNOSIS — R51 Headache: Secondary | ICD-10-CM | POA: Diagnosis not present

## 2016-02-28 NOTE — Progress Notes (Signed)
Subjective:     Patient ID: Devon Carter, male   DOB: 1971-03-19, 45 y.o.   MRN: [id number]  HPI Patient seen for acute visit for headache. Denies any history of chronic or frequent headaches. 2 days ago he was doing a Nurse, mental healthCrossFit workout and right middle workout developed sudden right occipital headache which eventually spread somewhat frontally and became somewhat more generalized. This was associated with lifting weights. He described this as sharp and throbbing and pulsatile and 9 out of 10 severity. Mild nausea without vomiting. Question of some bilateral blurred vision which was transient. No diplopia. No focal weakness. No confusion. He noted somewhat of a pulsatile quality  He went running Tuesday and had no headache. He doing crosstraining again earlier today and had very similar episode episode Monday. Same location and same quality and same associated symptoms.  Headache is resolving at this time.  No prior history of exertional migraine or exercise associated migraine. He stays very active.  He has been followed at [facility] and apparently is on testosterone replacement currently. Denies recent appetite or weight changes No confusion. No recent head injury.  Does relate remote history of seizures back in his adolescent years with unclear etiology. He had imaging at that time was reportedly unremarkable. He has not had any seizures now in several years.  Past Medical History:  Diagnosis Date  . Chronic kidney disease    kidney stones  . GERD (gastroesophageal reflux disease)   . Seizures (HCC)    grand mal, age 89  No medication since age 45 and no recurrent seizure.  . Vitamin B 12 deficiency   . Vitamin D deficiency disease    Past Surgical History:  Procedure Laterality Date  . APPENDECTOMY  1994  . HERNIA REPAIR  2002, 2010    reports that he has never smoked. He has never used smokeless tobacco. His alcohol and drug histories are not on file. family  history includes Arthritis in his mother; Cancer in his father; Heart disease in his father. Allergies  Allergen Reactions  . Percocet [Oxycodone-Acetaminophen] Nausea Only  . Celebrex [Celecoxib] Rash     Review of Systems  Constitutional: Negative for appetite change, chills, fever and unexpected weight change.  HENT: Negative for congestion and sinus pressure.   Eyes: Positive for visual disturbance (See history of present illness).  Respiratory: Negative for shortness of breath.   Cardiovascular: Negative for chest pain.  Gastrointestinal: Positive for nausea. Negative for abdominal pain and vomiting.  Neurological: Positive for headaches. Negative for dizziness, tremors, seizures, syncope and weakness.  Psychiatric/Behavioral: Negative for confusion.       Objective:   Physical Exam  Constitutional: He is oriented to person, place, and time. He appears well-developed and well-nourished.  HENT:  Right Ear: External ear normal.  Left Ear: External ear normal.  Eyes: Pupils are equal, round, and reactive to light.  Neck: Neck supple.  Cardiovascular: Normal rate and regular rhythm.   No murmur heard. Pulmonary/Chest: Effort normal and breath sounds normal. No respiratory distress. He has no wheezes. He has no rales.  Neurological: He is alert and oriented to person, place, and time. No cranial nerve deficit.       Assessment:     Recent onset of unilateral pulsatile exertional headache. Headache is very atypical in being unilateral and related to exertion. He does not have any focal neurologic findings at this time. His current headache does not sound typical for tension headache and vascular or migraine  headache is doubtful.  Needs further assessment    Plan:     -Set up MRI and MRA angiogram to further assess -Avoid any heavy exertion until further evaluated -Follow-up immediately for any worsening headache or any new neurologic symptoms  Devon CoveyBruce W Shalane Florendo MD [place]  [facility]

## 2016-02-28 NOTE — Patient Instructions (Signed)

## 2016-03-01 ENCOUNTER — Ambulatory Visit
Admission: RE | Admit: 2016-03-01 | Discharge: 2016-03-01 | Disposition: A | Discharge status: Home / Self Care | Payer: BLUE CROSS/BLUE SHIELD | Source: Ambulatory Visit | Attending: Family Medicine | Admitting: Family Medicine

## 2016-03-01 ENCOUNTER — Encounter: Payer: Self-pay | Admitting: Family Medicine

## 2016-03-01 DIAGNOSIS — G4484 Primary exertional headache: Secondary | ICD-10-CM

## 2016-03-07 ENCOUNTER — Encounter: Payer: Self-pay | Admitting: Family Medicine

## 2016-05-20 ENCOUNTER — Other Ambulatory Visit: Payer: Self-pay | Admitting: Cardiovascular Disease

## 2016-06-04 ENCOUNTER — Other Ambulatory Visit: Payer: Self-pay | Admitting: Orthopedic Surgery

## 2016-06-04 DIAGNOSIS — M25551 Pain in right hip: Secondary | ICD-10-CM

## 2016-06-04 DIAGNOSIS — M25552 Pain in left hip: Secondary | ICD-10-CM

## 2016-06-13 ENCOUNTER — Ambulatory Visit: Payer: BLUE CROSS/BLUE SHIELD | Admitting: Family Medicine

## 2016-06-20 ENCOUNTER — Other Ambulatory Visit: Payer: BLUE CROSS/BLUE SHIELD

## 2016-07-12 ENCOUNTER — Ambulatory Visit
Admission: RE | Admit: 2016-07-12 | Discharge: 2016-07-12 | Disposition: A | Discharge status: Home / Self Care | Payer: BLUE CROSS/BLUE SHIELD | Source: Ambulatory Visit | Attending: Orthopedic Surgery | Admitting: Orthopedic Surgery

## 2016-07-12 DIAGNOSIS — M25552 Pain in left hip: Secondary | ICD-10-CM

## 2016-07-12 DIAGNOSIS — M25551 Pain in right hip: Secondary | ICD-10-CM

## 2016-07-12 MED ORDER — IOPAMIDOL (ISOVUE-M 200) INJECTION 41%
15.0000 mL | Freq: Once | INTRAMUSCULAR | Status: AC
  Administered 2016-07-12: 15 mL via INTRA_ARTICULAR

## 2016-09-03 NOTE — Progress Notes (Signed)
Devon Carter D.O. [facility] [address] [place], [place] [address] Phone: [phone] Subjective:    I'm seeing this patient by the request  of:  Devon CoveyBURCHETTE,BRUCE W, MD   CC: Right groin pain  BJY:[id number]:Subjective  Devon Carter is a 46 y.o. male coming in with complaint of Right groin pain. Past medical history significant for 2 right sided inguinal hernia repairs. Patient states that this has some similarities bent and unfortunately in a different location. Patient thinks this could've been when patient was playing basketball initially. Patient does do a lot of weightlifting as well. Patient states that this pain started approximate 4 months ago. Dull, throbbing aching sensation. Patient didn't have pain and was seen by another provider who worked up his back as well as hips. Patient did have an MRI of bilateral hips. This was independently visualized by me. MRI showed no intra-articular pathology of the hips. Patient does have some increased hypoechoic changes of the pubic symphysis and possibly some straining of the right hip adductor.  Patient states that he tries to increase his activity at all healing of a sharp pain in the groin area on the right side. States that it initially started this testicle and now seems to be very localized. Minimal tightness of the leg states. Denies any fevers or chills. Denies any back pain. In any type of lateral movement seems to make it worse. Not responding to over-the-counter medications.     Past Medical History:  Diagnosis Date  . Chronic kidney disease    kidney stones  . GERD (gastroesophageal reflux disease)   . Seizures (HCC)    grand mal, age 46  No medication since age 46 and no recurrent seizure.  . Vitamin B 12 deficiency   . Vitamin D deficiency disease    Past Surgical History:  Procedure Laterality Date  . APPENDECTOMY  1994  . HERNIA REPAIR  2002, 2010   Social History   Social History  . Marital status: Married   Spouse name: N/A  . Number of children: N/A  . Years of education: N/A   Social History Main Topics  . Smoking status: Never Smoker  . Smokeless tobacco: Never Used  . Alcohol use Not on file  . Drug use: Unknown  . Sexual activity: Not on file   Other Topics Concern  . Not on file   Social History Narrative  . No narrative on file   Allergies  Allergen Reactions  . Percocet [Oxycodone-Acetaminophen] Nausea Only  . Celebrex [Celecoxib] Rash   Family History  Problem Relation Age of Onset  . Arthritis Mother   . Cancer Father     prostate, esophygeal  . Heart disease Father     CHF ? etiology    Past medical history, social, surgical and family history all reviewed in electronic medical record.  No pertanent information unless stated regarding to the chief complaint.   Review of Systems:Review of systems updated and as accurate as of 09/03/16  No headache, visual changes, nausea, vomiting, diarrhea, constipation, dizziness, abdominal pain, skin rash, fevers, chills, night sweats, weight loss, swollen lymph nodes, body aches, joint swelling,, chest pain, shortness of breath, mood changes.   Objective  There were no vitals taken for this visit. Systems examined below as of 09/03/16   General: No apparent distress alert and oriented x3 mood and affect normal, dressed appropriately.  HEENT: Pupils equal, extraocular movements intact  Respiratory: Patient's speak in full sentences and does not appear  short of breath  Cardiovascular: No lower extremity edema, non tender, no erythema  Skin: Warm dry intact with no signs of infection or rash on extremities or on axial skeleton.  Abdomen: Soft nontender  Neuro: Cranial nerves II through XII are intact, neurovascularly intact in all extremities with 2+ DTRs and 2+ pulses.  Lymph: No lymphadenopathy of posterior or anterior cervical chain or axillae bilaterally.  Gait normal with good balance and coordination.  MSK:  Non tender  with full range of motion and good stability and symmetric strength and tone of shoulders, elbows, wrist, knee and ankles bilaterally.  Hip: Right ROM IR: 15 Deg, ER: 25 Deg, Flexion: 120 Deg, Extension: 100 Deg, Abduction: 45 Deg, Adduction: 5 Deg with increasing pain again chooses to Strength IR: 5/5, ER: 5/5, Flexion: 5/5, Extension: 5/5, Abduction: 5/5, Adduction: 5/5 but does have severe pain with testing Pelvic alignment unremarkable to inspection and palpation. Standing hip rotation and gait without trendelenburg sign / unsteadiness. Greater trochanter without tenderness to palpation. No tenderness over piriformis and greater trochanter. No pain with FABER or FADIR. No SI joint tenderness and normal minimal SI movement.   MSK US performed of: right hip This study was ordered, performed, and interpreted by Terrilee Files D.O.  Hip: Trochanteric bursa without swelling or effusion. Acetabular labrum visualized and without tears, displacement, or effusion in joint. Femoral neck appears unremarkable without increased power doppler signal along Cortex. Patient's adductor. Bone does have what appears to be a nonhealing tear right at the origin off of the pubic bone. Mild increase in Doppler flow. No new hernia noted  IMPRESSION:  Likely nonhealing adductor strain or tear    Impression and Recommendations:     This case required medical decision making of moderate complexity.      Note: This dictation was prepared with Dragon dictation along with smaller phrase technology. Any transcriptional errors that result from this process are unintentional.

## 2016-09-04 ENCOUNTER — Ambulatory Visit (INDEPENDENT_AMBULATORY_CARE_PROVIDER_SITE_OTHER): Payer: BLUE CROSS/BLUE SHIELD | Admitting: Family Medicine

## 2016-09-04 ENCOUNTER — Encounter: Payer: Self-pay | Admitting: Family Medicine

## 2016-09-04 ENCOUNTER — Ambulatory Visit: Payer: Self-pay

## 2016-09-04 VITALS — BP 110/90 | HR 54 | Ht 71.0 in | Wt 182.0 lb

## 2016-09-04 DIAGNOSIS — R1031 Right lower quadrant pain: Secondary | ICD-10-CM

## 2016-09-04 DIAGNOSIS — S76201A Unspecified injury of adductor muscle, fascia and tendon of right thigh, initial encounter: Secondary | ICD-10-CM | POA: Diagnosis not present

## 2016-09-04 MED ORDER — NITROGLYCERIN 0.2 MG/HR TD PT24: MEDICATED_PATCH | TRANSDERMAL | 1 refills | Status: DC

## 2016-09-04 NOTE — Assessment & Plan Note (Signed)
Seen on ultrasound, minimal healing at this time.  Discussed avoiding activity with lateral movement, started nitro and warned of side effects.  Compression and ice.  RTC in 4 weeks.

## 2016-09-04 NOTE — Patient Instructions (Signed)
Good to see you.  Ice 20 minutes 2 times daily. Usually after activity and before bed. Thigh compression sleeve daily for 10 days then with activity thereafter Tylenol or ibuprofen if you need something extrac Avoid jumping, sprintnig, lateral movement or deep squats.  Nitroglycerin Protocol   Apply 1/4 nitroglycerin patch to affected area daily.  Change position of patch within the affected area every 24 hours.  You may experience a headache during the first 1-2 weeks of using the patch, these should subside.  If you experience headaches after beginning nitroglycerin patch treatment, you may take your preferred over the counter pain reliever.  Another side effect of the nitroglycerin patch is skin irritation or rash related to patch adhesive.  Please notify our office if you develop more severe headaches or rash, and stop the patch.  Tendon healing with nitroglycerin patch may require 12 to 24 weeks depending on the extent of injury.  Men should not use if taking Viagra, Cialis, or Levitra.   Do not use if you have migraines or rosacea.  Consider turmeric 500mg  twice daily  See me again in 3-4 weeks.

## 2016-09-30 ENCOUNTER — Ambulatory Visit: Payer: BLUE CROSS/BLUE SHIELD | Admitting: Family Medicine

## 2016-10-07 ENCOUNTER — Ambulatory Visit (INDEPENDENT_AMBULATORY_CARE_PROVIDER_SITE_OTHER): Payer: BLUE CROSS/BLUE SHIELD | Admitting: Family Medicine

## 2016-10-07 ENCOUNTER — Encounter: Payer: Self-pay | Admitting: Family Medicine

## 2016-10-07 VITALS — BP 110/76 | HR 66 | Temp 98.0°F | Ht 71.0 in | Wt 180.0 lb

## 2016-10-07 DIAGNOSIS — S76201D Unspecified injury of adductor muscle, fascia and tendon of right thigh, subsequent encounter: Secondary | ICD-10-CM | POA: Diagnosis not present

## 2016-10-07 DIAGNOSIS — S76211D Strain of adductor muscle, fascia and tendon of right thigh, subsequent encounter: Secondary | ICD-10-CM | POA: Diagnosis not present

## 2016-10-07 MED ORDER — VITAMIN D (ERGOCALCIFEROL) 1.25 MG (50000 UNIT) PO CAPS: 50000.0000 [IU] | ORAL_CAPSULE | ORAL | 0 refills | Status: DC

## 2016-10-07 NOTE — Assessment & Plan Note (Signed)
I believe the patient is making improvement. We discussed some pigmented different treatment options and patient has elected to try conservative therapy and start with physical therapy. We'll see how patient response. We did bring up the possibility of PRP injections if this fails secondary to longevity of the symptoms. Depending on patient's response we will see how patient does. Likely will have patient come back and we will ultrasound at follow-up.

## 2016-10-07 NOTE — Patient Instructions (Addendum)
Good to see you  I think you are doing well and will likely heal Continue to stay off the nitro.  Start once weekly vitamin D I think you can work on set shots if you are really feeling the itch.  Physical therapy will be calling you.  See me again in 3-6 weeks and we will ultrasound again and make sure you are doing great

## 2016-10-07 NOTE — Progress Notes (Signed)
[name] [address] [place], [place] [address] Phone: [phone] Subjective:    I'm seeing this patient by the request  of:  Kristian Covey, MD   CC: Right groin pain f/u  ION:[id number]  Devon Carter is a 46 y.o. male coming in with complaint of Right groin pain. Past medical history significant for 2 right sided inguinal hernia repairs. Patient did have an injury when he was playing basketball. Patient had more of a groin pain. Patient states it is more on the medial aspect. Patient states that he is doing approximately 50% better. Felt the nitroglycerin was helping but unfortunately then started having a rash to discontinue it this week. Patient's denies any new symptoms. States that any quick movement still causes the discomfort. Patient is frustrated because it has been 6 months and continues to have pain but is only been 3 weeks since we seen him. Once again 50% better.       Past Medical History:  Diagnosis Date  . Chronic kidney disease    kidney stones  . GERD (gastroesophageal reflux disease)   . Seizures (HCC)    grand mal, age 6  No medication since age 85 and no recurrent seizure.  . Vitamin B 12 deficiency   . Vitamin D deficiency disease    Past Surgical History:  Procedure Laterality Date  . APPENDECTOMY  1994  . HERNIA REPAIR  2002, 2010   Social History   Social History  . Marital status: Married    Spouse name: N/A  . Number of children: N/A  . Years of education: N/A   Social History Main Topics  . Smoking status: Never Smoker  . Smokeless tobacco: Never Used  . Alcohol use Not on file  . Drug use: Unknown  . Sexual activity: Not on file   Other Topics Concern  . Not on file   Social History Narrative  . No narrative on file   Allergies  Allergen Reactions  . Percocet [Oxycodone-Acetaminophen] Nausea Only  . Celebrex [Celecoxib] Rash   Family History  Problem Relation Age of Onset  . Arthritis  Mother   . Cancer Father     prostate, esophygeal  . Heart disease Father     CHF ? etiology    Past medical history, social, surgical and family history all reviewed in electronic medical record.  No pertanent information unless stated regarding to the chief complaint.  Review of Systems: No headache, visual changes, nausea, vomiting, diarrhea, constipation, dizziness, abdominal pain, skin rash, fevers, chills, night sweats, weight loss, swollen lymph nodes, body aches, joint swelling, muscle aches, chest pain, shortness of breath, mood changes.    Objective  There were no vitals taken for this visit.   Systems examined below as of 10/07/16 General: NAD A&O x3 mood, affect normal  HEENT: Pupils equal, extraocular movements intact no nystagmus Respiratory: not short of breath at rest or with speaking Cardiovascular: No lower extremity edema, non tender Skin: Warm dry intact with no signs of infection or rash on extremities or on axial skeleton. Abdomen: Soft nontender, no masses Neuro: Cranial nerves  intact, neurovascularly intact in all extremities with 2+ DTRs and 2+ pulses. Lymph: No lymphadenopathy appreciated today  Gait normal with good balance and coordination.  MSK: Non tender with full range of motion and good stability and symmetric strength and tone of shoulders, elbows, wrist,  knee hips and ankles bilaterally.   Hip: Right ROM IR: 15 Deg, ER:  25 Deg, Flexion: 120 Deg, Extension: 100 Deg, Abduction: 45 Deg, Adduction: Has increasing range of motion but still has pain against resistance. IR: 5/5, ER: 5/5, Flexion: 5/5, Extension: 5/5, Abduction: 5/5, Adduction: 5/5 still has pain against resistance but improved. Pelvic alignment unremarkable to inspection and palpation. Standing hip rotation and gait without trendelenburg sign / unsteadiness. Greater trochanter without tenderness to palpation. No tenderness over piriformis and greater trochanter. No pain with FABER or  FADIR. No SI joint tenderness and normal minimal SI movement.    Impression and Recommendations:     This case required medical decision making of moderate complexity.      Note: This dictation was prepared with Dragon dictation along with smaller phrase technology. Any transcriptional errors that result from this process are unintentional.

## 2016-10-23 NOTE — Progress Notes (Signed)
[name] [address] [place], [place] [address] Phone: [phone] Subjective:    I'm seeing this patient by the request  of:  Kristian Covey, MD   CC: Right groin pain f/u  BJY:[id number]  Devon Carter is a 46 y.o. male coming in with complaint of Right groin pain. Past medical history significant for 2 right sided inguinal hernia repairs. Patient did have an injury when he was playing basketball. Patient had more of a groin pain. Patient is making great strides. Has started increasing her activity. Still has not playing basketball. Patient though states that he is feeling 85-90% better. Patient states that there is some soreness when he starts certain activities but then it seems to go away within seconds. Patient's has not done any jumping at this time. Patient once to advance if possible. Once again past medical history significant for 2 right-sided inguinal hernia repairs but states that this does feel somewhat different.       Past Medical History:  Diagnosis Date  . Chronic kidney disease    kidney stones  . GERD (gastroesophageal reflux disease)   . Seizures (HCC)    grand mal, age 4  No medication since age 24 and no recurrent seizure.  . Vitamin B 12 deficiency   . Vitamin D deficiency disease    Past Surgical History:  Procedure Laterality Date  . APPENDECTOMY  1994  . HERNIA REPAIR  2002, 2010   Social History   Social History  . Marital status: Married    Spouse name: N/A  . Number of children: N/A  . Years of education: N/A   Social History Main Topics  . Smoking status: Never Smoker  . Smokeless tobacco: Never Used  . Alcohol use None  . Drug use: Unknown  . Sexual activity: Not Asked   Other Topics Concern  . None   Social History Narrative  . None   Allergies  Allergen Reactions  . Percocet [Oxycodone-Acetaminophen] Nausea Only  . Celebrex [Celecoxib] Rash   Family History  Problem Relation Age of  Onset  . Arthritis Mother   . Cancer Father     prostate, esophygeal  . Heart disease Father     CHF ? etiology    Past medical history, social, surgical and family history all reviewed in electronic medical record.  No pertanent information unless stated regarding to the chief complaint.    Review of Systems: No headache, visual changes, nausea, vomiting, diarrhea, constipation, dizziness, abdominal pain, skin rash, fevers, chills, night sweats, weight loss, swollen lymph nodes, body aches, joint swelling, muscle aches, chest pain, shortness of breath, mood changes.       Objective  Blood pressure 116/86, pulse 72, resp. rate 16, weight 183 lb (83 kg), SpO2 98 %.   Systems examined below as of 10/24/16 General: NAD A&O x3 mood, affect normal  HEENT: Pupils equal, extraocular movements intact no nystagmus Respiratory: not short of breath at rest or with speaking Cardiovascular: No lower extremity edema, non tender Skin: Warm dry intact with no signs of infection or rash on extremities or on axial skeleton. Abdomen: Soft nontender, no masses Neuro: Cranial nerves  intact, neurovascularly intact in all extremities with 2+ DTRs and 2+ pulses. Lymph: No lymphadenopathy appreciated today  Gait normal with good balance and coordination.  MSK: Non tender with full range of motion and good stability and symmetric strength and tone of shoulders, elbows, wrist,  knee and ankles bilaterally.  Hip: Right ROM IR: 25 Deg, ER: 45 Deg, Flexion: 120 Deg, Extension: 100 Deg, Abduction: 45 Deg, Adduction: 45 Deg Strength IR: 5/5, ER: 5/5, Flexion: 5/5, Extension: 5/5, Abduction: 5/5, Adduction: 5/5 Pelvic alignment unremarkable to inspection and palpation. Standing hip rotation and gait without trendelenburg sign / unsteadiness. Greater trochanter without tenderness to palpation. No tenderness over piriformis and greater trochanter. No pain with FABER or FADIR. No SI joint tenderness and normal  minimal SI movement.   Impression and Recommendations:     This case required medical decision making of moderate complexity.      Note: This dictation was prepared with Dragon dictation along with smaller phrase technology. Any transcriptional errors that result from this process are unintentional.

## 2016-10-24 ENCOUNTER — Ambulatory Visit (INDEPENDENT_AMBULATORY_CARE_PROVIDER_SITE_OTHER): Payer: BLUE CROSS/BLUE SHIELD | Admitting: Family Medicine

## 2016-10-24 ENCOUNTER — Encounter: Payer: Self-pay | Admitting: Family Medicine

## 2016-10-24 DIAGNOSIS — S76201D Unspecified injury of adductor muscle, fascia and tendon of right thigh, subsequent encounter: Secondary | ICD-10-CM | POA: Diagnosis not present

## 2016-10-24 NOTE — Assessment & Plan Note (Signed)
Patient is doing significantly better at this time. Discontinue the nitroglycerin. Increase patient's activity as tolerated. We discussed icing regimen and home exercises. We discussed which activities doing which ones to avoid. Patient will start increasing activity as tolerated and more sports specific over the course of next several weeks. Worsening symptoms to come back for further evaluation.

## 2016-10-24 NOTE — Progress Notes (Signed)
Pre-visit discussion using our clinic review tool. No additional management support is needed unless otherwise documented below in the visit note.  

## 2016-10-30 ENCOUNTER — Ambulatory Visit: Payer: BLUE CROSS/BLUE SHIELD | Admitting: Family Medicine

## 2016-11-21 ENCOUNTER — Encounter: Payer: Self-pay | Admitting: Family Medicine

## 2016-11-21 ENCOUNTER — Ambulatory Visit (INDEPENDENT_AMBULATORY_CARE_PROVIDER_SITE_OTHER): Payer: BLUE CROSS/BLUE SHIELD | Admitting: Family Medicine

## 2016-11-21 DIAGNOSIS — S76201D Unspecified injury of adductor muscle, fascia and tendon of right thigh, subsequent encounter: Secondary | ICD-10-CM | POA: Diagnosis not present

## 2016-11-21 MED ORDER — MONTELUKAST SODIUM 10 MG PO TABS: 10.0000 mg | ORAL_TABLET | Freq: Every day | ORAL | 3 refills | Status: DC

## 2016-11-21 NOTE — Patient Instructions (Signed)
Good to see you  singulair daily for next month.  Still ice after activity  Continue the vitamins if you want.  Isometric like planks for stability and core Keep feet planted for most exercises and focus on eccentrics See me again in 4 weeks.

## 2016-11-21 NOTE — Progress Notes (Signed)
[name] [address] [place], [place] [address] Phone: [phone] Subjective:    I'm seeing this patient by the request  of:  Kristian Covey, MD   CC: Right groin pain f/u  BJY:[id number]  Devon Carter is a 46 y.o. male coming in with complaint of Right groin pain. Past medical history significant for 2 right sided inguinal hernia repairs. Patient did have an injury when he was playing basketball. Patient had more of a groin pain.  Was 90% better at last exam and was increasing activity.  Patient states had set back 2 weeks ago, had pain in the area and more abdomen then original area. No swelling. Started increasing activity again and now feeling better again. Once to increase activity safely though. Denies any radiation, any numbness, any weakness.       Past Medical History:  Diagnosis Date  . Chronic kidney disease    kidney stones  . GERD (gastroesophageal reflux disease)   . Seizures (HCC)    grand mal, age 49  No medication since age 64 and no recurrent seizure.  . Vitamin B 12 deficiency   . Vitamin D deficiency disease    Past Surgical History:  Procedure Laterality Date  . APPENDECTOMY  1994  . HERNIA REPAIR  2002, 2010   Social History   Social History  . Marital status: Married    Spouse name: N/A  . Number of children: N/A  . Years of education: N/A   Social History Main Topics  . Smoking status: Never Smoker  . Smokeless tobacco: Never Used  . Alcohol use None  . Drug use: Unknown  . Sexual activity: Not Asked   Other Topics Concern  . None   Social History Narrative  . None   Allergies  Allergen Reactions  . Percocet [Oxycodone-Acetaminophen] Nausea Only  . Celebrex [Celecoxib] Rash   Family History  Problem Relation Age of Onset  . Arthritis Mother   . Cancer Father        prostate, esophygeal  . Heart disease Father        CHF ? etiology    Past medical history, social, surgical and family  history all reviewed in electronic medical record.  No pertanent information unless stated regarding to the chief complaint.    Review of Systems: No headache, visual changes, nausea, vomiting, diarrhea, constipation, dizziness, abdominal pain, skin rash, fevers, chills, night sweats, weight loss, swollen lymph nodes, body aches, joint swelling, muscle aches, chest pain, shortness of breath, mood changes.    Objective  Blood pressure 120/80, pulse 68, resp. rate 16, weight 183 lb (83 kg), SpO2 97 %.   Systems examined below as of 11/21/16 General: NAD A&O x3 mood, affect normal  HEENT: Pupils equal, extraocular movements intact no nystagmus Respiratory: not short of breath at rest or with speaking Cardiovascular: No lower extremity edema, non tender Skin: Warm dry intact with no signs of infection or rash on extremities or on axial skeleton. Abdomen: very mild discomfort over area of mesh right lower quadrant Neuro: Cranial nerves  intact, neurovascularly intact in all extremities with 2+ DTRs and 2+ pulses. Lymph: No lymphadenopathy appreciated today  Gait normal with good balance and coordination.  MSK: Non tender with full range of motion and good stability and symmetric strength and tone of shoulders, elbows, wrist,  knee and ankles bilaterally.   QMV:HQION ROM IR: 25 Deg, ER: 45 Deg, Flexion: 120 Deg, Extension: 100 Deg, Abduction:  45 Deg, Adduction: 45 Deg Strength IR: 5/5, ER: 5/5, Flexion: 5/5, Extension: 5/5, Abduction: 5/5, Adduction: 5/5 Pelvic alignment unremarkable to inspection and palpation. Standing hip rotation and gait without trendelenburg sign / unsteadiness. Greater trochanter without tenderness to palpation. No tenderness over piriformis and greater trochanter. Pain with internal range of motion No SI joint tenderness and normal minimal SI movement.  Impression and Recommendations:     This case required medical decision making of moderate complexity.       Note: This dictation was prepared with Dragon dictation along with smaller phrase technology. Any transcriptional errors that result from this process are unintentional.

## 2016-11-21 NOTE — Assessment & Plan Note (Signed)
Improved at this time.  Continue to increase activity  Warn of other things to avoid.  Think it could be secondary to mesh start singulair and see if improvement.  RTC in 4 weeks.  Spent  25 minutes with patient face-to-face and had greater than 50% of counseling including as described above in assessment and plan.

## 2016-12-24 ENCOUNTER — Ambulatory Visit: Payer: BLUE CROSS/BLUE SHIELD | Admitting: Family Medicine

## 2016-12-28 ENCOUNTER — Other Ambulatory Visit: Payer: Self-pay | Admitting: Family Medicine

## 2016-12-30 NOTE — Telephone Encounter (Signed)
Refill done.  

## 2017-03-05 ENCOUNTER — Ambulatory Visit: Payer: BLUE CROSS/BLUE SHIELD | Admitting: Family Medicine

## 2017-03-06 ENCOUNTER — Ambulatory Visit: Payer: BLUE CROSS/BLUE SHIELD | Admitting: Family Medicine

## 2017-03-10 ENCOUNTER — Encounter: Payer: Self-pay | Admitting: *Deleted

## 2017-03-10 ENCOUNTER — Encounter: Payer: Self-pay | Admitting: Family Medicine

## 2017-03-10 ENCOUNTER — Ambulatory Visit (INDEPENDENT_AMBULATORY_CARE_PROVIDER_SITE_OTHER): Payer: BLUE CROSS/BLUE SHIELD | Admitting: Family Medicine

## 2017-03-10 DIAGNOSIS — S76201D Unspecified injury of adductor muscle, fascia and tendon of right thigh, subsequent encounter: Secondary | ICD-10-CM | POA: Diagnosis not present

## 2017-03-10 MED ORDER — VITAMIN D (ERGOCALCIFEROL) 1.25 MG (50000 UNIT) PO CAPS: 50000.0000 [IU] | ORAL_CAPSULE | ORAL | 0 refills | Status: DC

## 2017-03-10 MED ORDER — NITROGLYCERIN 0.2 MG/HR TD PT24: MEDICATED_PATCH | TRANSDERMAL | 1 refills | Status: DC

## 2017-03-10 NOTE — Patient Instructions (Addendum)
Good to see you  Continue the thigh compression with  exercises  Nothing this week.  Then start 50% of what you were doing and then in 3 weeks go back to everything but orange theory  Once weekly vitamin D and get K2 over the counter.  Nitroglycerin Protocol   Apply 1/4 nitroglycerin patch to affected area daily.  Change position of patch within the affected area every 24 hours.  You may experience a headache during the first 1-2 weeks of using the patch, these should subside.  If you experience headaches after beginning nitroglycerin patch treatment, you may take your preferred over the counter pain reliever.  Another side effect of the nitroglycerin patch is skin irritation or rash related to patch adhesive.  Please notify our office if you develop more severe headaches or rash, and stop the patch.  Tendon healing with nitroglycerin patch may require 12 to 24 weeks depending on the extent of injury.  Men should not use if taking Viagra, Cialis, or Levitra.   Do not use if you have migraines or rosacea.  See me again in 4 weeks and if not better we will consider the PRP injection.

## 2017-03-10 NOTE — Progress Notes (Signed)
[name] [address] [place], [place] [address] Phone: [phone] Subjective:    I'm seeing this patient by the request  of:  Kristian Covey, MD   CC: Right groin pain f/u  [id number]  Devon Carter is a 46 y.o. male coming in with complaint of Right groin pain. Past medical history significant for 2 right sided inguinal hernia repairs. Patient did have an injury when he was playing basketball.   Has been 3 months since we saw patient and he was 90% better at that time. Patient was to start increasing activity. Patient states Was never let her percent better. Patient states that while playing tennis had an injury a couple weeks ago. Feels like it is worsening. Patient states even those and during the day at this moment.      Past Medical History:  Diagnosis Date  . Chronic kidney disease    kidney stones  . GERD (gastroesophageal reflux disease)   . Seizures (HCC)    grand mal, age 53  No medication since age 4 and no recurrent seizure.  . Vitamin B 12 deficiency   . Vitamin D deficiency disease    Past Surgical History:  Procedure Laterality Date  . APPENDECTOMY  1994  . HERNIA REPAIR  2002, 2010   Social History   Social History  . Marital status: Married    Spouse name: N/A  . Number of children: N/A  . Years of education: N/A   Social History Main Topics  . Smoking status: Never Smoker  . Smokeless tobacco: Never Used  . Alcohol use None  . Drug use: Unknown  . Sexual activity: Not Asked   Other Topics Concern  . None   Social History Narrative  . None   Allergies  Allergen Reactions  . Percocet [Oxycodone-Acetaminophen] Nausea Only  . Celebrex [Celecoxib] Rash   Family History  Problem Relation Age of Onset  . Arthritis Mother   . Cancer Father        prostate, esophygeal  . Heart disease Father        CHF ? etiology    Past medical history, social, surgical and family history all reviewed in  electronic medical record.  No pertanent information unless stated regarding to the chief complaint.    Review of Systems: No headache, visual changes, nausea, vomiting, diarrhea, constipation, dizziness, abdominal pain, skin rash, fevers, chills, night sweats, weight loss, swollen lymph nodes, body aches, joint swelling,  chest pain, shortness of breath, mood changes.  Positive muscle aches   Objective  Blood pressure 132/80, pulse 61, height 5\' 11"  (1.803 m), weight 181 lb (82.1 kg), SpO2 97 %.   Systems examined below as of 03/10/17 General: NAD A&O x3 mood, affect normal  HEENT: Pupils equal, extraocular movements intact no nystagmus Respiratory: not short of breath at rest or with speaking Cardiovascular: No lower extremity edema, non tender Skin: Warm dry intact with no signs of infection or rash on extremities or on axial skeleton. Abdomen: Soft nontender, no masses Neuro: Cranial nerves  intact, neurovascularly intact in all extremities with 2+ DTRs and 2+ pulses. Lymph: No lymphadenopathy appreciated today  Gait normal with good balance and coordination.  MSK: Non tender with full range of motion and good stability and symmetric strength and tone of shoulders, elbows, wrist,  knee and ankles bilaterally.   Hip: Right ROM IR: 15 Deg, ER: 45 Deg, Flexion: 120 Deg, Extension: 100 Deg, Abduction: 45 Deg, Adduction:  25 Deg Strength Pain with resisted adduction. Mild pain with flexion as well Pelvic alignment unremarkable to inspection and palpation. Standing hip rotation and gait without trendelenburg sign / unsteadiness. Greater trochanter without tenderness to palpation. No tenderness over piriformis and greater trochanter. No pain with FABER or FADIR. Pain orthopedic symphysis noted No SI joint tenderness and normal minimal SI movement. Contralateral hip unremarkable  Limited musculoskeletal ultrasound was performed and interpreted by Judi Saa  Limited ultrasound the  patient's right groin area shows the patient does have a tear noted with appears to be an avulsion fracture of the inferior right pubic bone. Mild hypoechoic changes and increasing Doppler flow in the area. Impression: Adductor tear with avulsion fracture  Impression and Recommendations:     This case required medical decision making of moderate complexity.      Note: This dictation was prepared with Dragon dictation along with smaller phrase technology. Any transcriptional errors that result from this process are unintentional.

## 2017-03-10 NOTE — Assessment & Plan Note (Signed)
Worsening symptoms with what appears to be a tear. Started once weekly vitamin D to help with the possible avulsion fracture. Nitroglycerin in the area that he think will be beneficial. We discussed the possibility of PRP. Patient will urgently decrease activity this time. Patient will follow-up with me again in 4 weeks.

## 2017-04-03 ENCOUNTER — Encounter: Payer: Self-pay | Admitting: Family Medicine

## 2017-04-08 NOTE — Progress Notes (Signed)
[name] [address] [place], [place] [address] Phone: [phone] Subjective:    I'm seeing this patient by the request  of:    CC: Right groin pain follow up,  [id number]  Devon Carter is a 46 y.o. male coming in with complaint of right groin pain. Seems to have more discomfort and likely muscle tear. Started nitroglycerin patches one month ago and was to increase activity slowly. Patient also started on once weekly vitamin D. Patient states he feels better. Says he feels sore when he exercises. Patient states that in with daily activities it does not notice at all at this point. No side effects to the nitroglycerin.      Past Medical History:  Diagnosis Date  . Chronic kidney disease    kidney stones  . GERD (gastroesophageal reflux disease)   . Seizures (HCC)    grand mal, age 38  No medication since age 69 and no recurrent seizure.  . Vitamin B 12 deficiency   . Vitamin D deficiency disease    Past Surgical History:  Procedure Laterality Date  . APPENDECTOMY  1994  . HERNIA REPAIR  2002, 2010   Social History   Social History  . Marital status: Married    Spouse name: N/A  . Number of children: N/A  . Years of education: N/A   Social History Main Topics  . Smoking status: Never Smoker  . Smokeless tobacco: Never Used  . Alcohol use None  . Drug use: Unknown  . Sexual activity: Not Asked   Other Topics Concern  . None   Social History Narrative  . None   Allergies  Allergen Reactions  . Percocet [Oxycodone-Acetaminophen] Nausea Only  . Celebrex [Celecoxib] Rash   Family History  Problem Relation Age of Onset  . Arthritis Mother   . Cancer Father        prostate, esophygeal  . Heart disease Father        CHF ? etiology     Past medical history, social, surgical and family history all reviewed in electronic medical record.  No pertanent information unless stated regarding to the chief complaint.   Review of  Systems:Review of systems updated and as accurate as of 04/09/17  No headache, visual changes, nausea, vomiting, diarrhea, constipation, dizziness, abdominal pain, skin rash, fevers, chills, night sweats, weight loss, swollen lymph nodes, body aches, joint swelling, chest pain, shortness of breath, mood changes. Positive muscle aches  Objective  Blood pressure 114/80, pulse 67, height 6' (1.829 m), weight 180 lb (81.6 kg), SpO2 97 %. Systems examined below as of 04/09/17   General: No apparent distress alert and oriented x3 mood and affect normal, dressed appropriately.  HEENT: Pupils equal, extraocular movements intact  Respiratory: Patient's speak in full sentences and does not appear short of breath  Cardiovascular: No lower extremity edema, non tender, no erythema  Skin: Warm dry intact with no signs of infection or rash on extremities or on axial skeleton.  Abdomen: Soft nontender  Neuro: Cranial nerves II through XII are intact, neurovascularly intact in all extremities with 2+ DTRs and 2+ pulses.  Lymph: No lymphadenopathy of posterior or anterior cervical chain or axillae bilaterally.  Gait normal with good balance and coordination.  MSK:  Non tender with full range of motion and good stability and symmetric strength and tone of shoulders, elbows, wrist,  knee and ankles bilaterally.  Right hip exam shows the patient still has some pain with  resisted adduction of the hip. No pain with internal rotation or Faber test. Patient neurovascular intact distally. Negative straight leg test. Full strength of the ankle. No pain of the back.  Limited musculoskeletal ultrasound was performed and interpreted by Judi Saa  Limited ultrasound in the pelvic region shows the patient's previous possible stress reaction seems to be healing at this time. Still some hypoechoic changes of the tendon but no true tear appreciated. No increase in Doppler flow. Impression: Likely healed or nearly healed  groin strain   Impression and Recommendations:     This case required medical decision making of moderate complexity.      Note: This dictation was prepared with Dragon dictation along with smaller phrase technology. Any transcriptional errors that result from this process are unintentional.

## 2017-04-09 ENCOUNTER — Ambulatory Visit: Payer: Self-pay

## 2017-04-09 ENCOUNTER — Ambulatory Visit (INDEPENDENT_AMBULATORY_CARE_PROVIDER_SITE_OTHER): Payer: BLUE CROSS/BLUE SHIELD | Admitting: Family Medicine

## 2017-04-09 ENCOUNTER — Encounter: Payer: Self-pay | Admitting: Family Medicine

## 2017-04-09 VITALS — BP 114/80 | HR 67 | Ht 72.0 in | Wt 180.0 lb

## 2017-04-09 DIAGNOSIS — M79604 Pain in right leg: Secondary | ICD-10-CM

## 2017-04-09 DIAGNOSIS — S76201D Unspecified injury of adductor muscle, fascia and tendon of right thigh, subsequent encounter: Secondary | ICD-10-CM

## 2017-04-09 NOTE — Patient Instructions (Signed)
Much better overall.  Ice is your friend still after activity  Continue the nitro another month.  I think you are good to go but slow No jumping other then basketball for another 2 weeks at least  Ok orange theory but do more biking.  See me again in 6 weeks  If pain get worse we will need to consider MRI or we can discuss the PRP but we may want the MRI or CT scan first

## 2017-04-09 NOTE — Assessment & Plan Note (Signed)
I do believe the patient is making some progress. There is a possibility for a stress reaction previously. We discussed with patient to continue the same regimen at this time. Increase activity as tolerated including wearing the thigh compression sleeve on a more regular basis. We discussed avoiding still jumping or sprinting. Follow-up again in 6 weeks.

## 2017-05-21 ENCOUNTER — Ambulatory Visit: Payer: BLUE CROSS/BLUE SHIELD | Admitting: Family Medicine

## 2017-06-13 DIAGNOSIS — E559 Vitamin D deficiency, unspecified: Secondary | ICD-10-CM | POA: Diagnosis not present

## 2017-06-13 DIAGNOSIS — R5383 Other fatigue: Secondary | ICD-10-CM | POA: Diagnosis not present

## 2017-06-13 DIAGNOSIS — R7989 Other specified abnormal findings of blood chemistry: Secondary | ICD-10-CM | POA: Diagnosis not present

## 2017-06-13 DIAGNOSIS — E291 Testicular hypofunction: Secondary | ICD-10-CM | POA: Diagnosis not present

## 2017-07-18 DIAGNOSIS — R7989 Other specified abnormal findings of blood chemistry: Secondary | ICD-10-CM | POA: Diagnosis not present

## 2017-07-18 DIAGNOSIS — B379 Candidiasis, unspecified: Secondary | ICD-10-CM | POA: Diagnosis not present

## 2017-08-06 ENCOUNTER — Encounter: Payer: Self-pay | Admitting: Family Medicine

## 2017-08-06 ENCOUNTER — Ambulatory Visit (INDEPENDENT_AMBULATORY_CARE_PROVIDER_SITE_OTHER): Payer: BLUE CROSS/BLUE SHIELD | Admitting: Family Medicine

## 2017-08-06 VITALS — BP 102/60 | HR 58 | Temp 97.7°F | Ht 72.0 in | Wt 180.5 lb

## 2017-08-06 DIAGNOSIS — R7989 Other specified abnormal findings of blood chemistry: Secondary | ICD-10-CM | POA: Diagnosis not present

## 2017-08-06 DIAGNOSIS — Z0000 Encounter for general adult medical examination without abnormal findings: Secondary | ICD-10-CM | POA: Diagnosis not present

## 2017-08-06 NOTE — Patient Instructions (Signed)
Return tomorrow for labs.

## 2017-08-06 NOTE — Progress Notes (Signed)
Subjective:     Patient ID: Devon Carter, male   DOB: Jun 25, 1971, 47 y.o.   MRN: [id number]  HPI Patient seen for physical exam. He's been followed recently [facility] and had recent multiple labs there in November and brings a copy of those in today. He does have history of low testosterone and has been on replacement through them. Recent PSA normal. Recent CBC normal. We looked over his labs and did have creatinine 1.43. He's had over 20 kidney stones in his lifetime.  Tetanus up-to-date.  Patient exercises regularly most days of the week. Smoked only briefly in college. No regular alcohol use.  Family history reviewed. Mother had rheumatoid arthritis. Father died of complications of congestive heart failure and esophageal cancer.   No known family history of premature CAD  He had recent erythematous/transient rash distal penis with some mild burning with urination for just couple days and none now. Monogamous. No history of STD  Past Medical History:  Diagnosis Date  . Chronic kidney disease    kidney stones  . GERD (gastroesophageal reflux disease)   . Seizures (HCC)    grand mal, age 38  No medication since age 60 and no recurrent seizure.  . Vitamin B 12 deficiency   . Vitamin D deficiency disease    Past Surgical History:  Procedure Laterality Date  . APPENDECTOMY  1994  . HERNIA REPAIR  2002, 2010    reports that  has never smoked. he has never used smokeless tobacco. His alcohol and drug histories are not on file. family history includes Arthritis in his mother; Cancer in his father; Heart disease in his father. Allergies  Allergen Reactions  . Percocet [Oxycodone-Acetaminophen] Nausea Only  . Celebrex [Celecoxib] Rash     Review of Systems  Constitutional: Negative for activity change, appetite change, fatigue and fever.  HENT: Negative for congestion, ear pain and trouble swallowing.   Eyes: Negative for pain and visual disturbance.   Respiratory: Negative for cough, shortness of breath and wheezing.   Cardiovascular: Negative for chest pain and palpitations.  Gastrointestinal: Negative for abdominal distention, abdominal pain, blood in stool, constipation, diarrhea, nausea, rectal pain and vomiting.  Genitourinary: Negative for dysuria, hematuria and testicular pain.  Musculoskeletal: Negative for arthralgias and joint swelling.  Skin: Negative for rash.  Neurological: Negative for dizziness, syncope and headaches.  Hematological: Negative for adenopathy.  Psychiatric/Behavioral: Negative for confusion and dysphoric mood.       Objective:   Physical Exam  Constitutional: He is oriented to person, place, and time. He appears well-developed and well-nourished. No distress.  HENT:  Head: Normocephalic and atraumatic.  Right Ear: External ear normal.  Left Ear: External ear normal.  Mouth/Throat: Oropharynx is clear and moist.  Eyes: Conjunctivae and EOM are normal. Pupils are equal, round, and reactive to light.  Neck: Normal range of motion. Neck supple. No thyromegaly present.  Cardiovascular: Normal rate, regular rhythm and normal heart sounds.  No murmur heard. Pulmonary/Chest: No respiratory distress. He has no wheezes. He has no rales.  Abdominal: Soft. Bowel sounds are normal. He exhibits no distension and no mass. There is no tenderness. There is no rebound and no guarding.  Musculoskeletal: He exhibits no edema.  Lymphadenopathy:    He has no cervical adenopathy.  Neurological: He is alert and oriented to person, place, and time. He displays normal reflexes. No cranial nerve deficit.  Skin: No rash noted.  Psychiatric: He has a normal mood and affect.  Assessment:     Physical exam. The following issues were addressed    Plan:     -Check basic metabolic panel-follow up recent creatinine of 1.4.   -Check urinalysis -Continue regular exercise habits  Kristian CoveyBruce W Audrionna Lampton MD [facility]

## 2017-08-07 ENCOUNTER — Other Ambulatory Visit (INDEPENDENT_AMBULATORY_CARE_PROVIDER_SITE_OTHER): Payer: BLUE CROSS/BLUE SHIELD

## 2017-08-07 ENCOUNTER — Encounter: Payer: Self-pay | Admitting: Family Medicine

## 2017-08-07 DIAGNOSIS — Z0000 Encounter for general adult medical examination without abnormal findings: Secondary | ICD-10-CM

## 2017-08-07 DIAGNOSIS — R7989 Other specified abnormal findings of blood chemistry: Secondary | ICD-10-CM

## 2017-08-07 LAB — POCT URINALYSIS DIPSTICK
Bilirubin, UA: NEGATIVE
Blood, UA: NEGATIVE
Glucose, UA: NEGATIVE
KETONES UA: NEGATIVE
Leukocytes, UA: NEGATIVE
Nitrite, UA: NEGATIVE
ODOR: NEGATIVE
PROTEIN UA: NEGATIVE
Spec Grav, UA: 1.01 (ref 1.010–1.025)
Urobilinogen, UA: 0.2 E.U./dL
pH, UA: 6.5 (ref 5.0–8.0)

## 2017-08-07 LAB — BASIC METABOLIC PANEL
BUN: 22 mg/dL (ref 6–23)
CO2: 30 meq/L (ref 19–32)
Calcium: 9.4 mg/dL (ref 8.4–10.5)
Chloride: 102 mEq/L (ref 96–112)
Creatinine, Ser: 1.13 mg/dL (ref 0.40–1.50)
GFR: 74.54 mL/min (ref >60.00)
GLUCOSE: 93 mg/dL (ref 70–99)
Potassium: 4.2 mEq/L (ref 3.5–5.1)
SODIUM: 138 meq/L (ref 135–145)

## 2017-12-25 DIAGNOSIS — D225 Melanocytic nevi of trunk: Secondary | ICD-10-CM | POA: Diagnosis not present

## 2017-12-25 DIAGNOSIS — D224 Melanocytic nevi of scalp and neck: Secondary | ICD-10-CM | POA: Diagnosis not present

## 2017-12-25 DIAGNOSIS — D2261 Melanocytic nevi of right upper limb, including shoulder: Secondary | ICD-10-CM | POA: Diagnosis not present

## 2018-03-11 ENCOUNTER — Other Ambulatory Visit: Payer: Self-pay | Admitting: Cardiovascular Disease

## 2018-03-11 MED ORDER — OSELTAMIVIR PHOSPHATE 75 MG PO CAPS: 75.0000 mg | ORAL_CAPSULE | Freq: Two times a day (BID) | ORAL | 1 refills | Status: DC

## 2018-03-11 NOTE — Progress Notes (Unsigned)
Chris's son has the flu Dr. Orvan Falconerampbell recommended Tamiflu for those exposed     Kristeen MissPhilip Nahser, MD  03/11/2018 11:31 AM    [facility] [address],  [address] [place], [place]  [id number] Pager [phone] Phone: [phone]) [phone]; Fax: [phone]

## 2018-04-16 DIAGNOSIS — M9902 Segmental and somatic dysfunction of thoracic region: Secondary | ICD-10-CM | POA: Diagnosis not present

## 2018-04-16 DIAGNOSIS — M9905 Segmental and somatic dysfunction of pelvic region: Secondary | ICD-10-CM | POA: Diagnosis not present

## 2018-04-16 DIAGNOSIS — M9903 Segmental and somatic dysfunction of lumbar region: Secondary | ICD-10-CM | POA: Diagnosis not present

## 2018-04-16 DIAGNOSIS — M5441 Lumbago with sciatica, right side: Secondary | ICD-10-CM | POA: Diagnosis not present

## 2018-04-17 DIAGNOSIS — M9902 Segmental and somatic dysfunction of thoracic region: Secondary | ICD-10-CM | POA: Diagnosis not present

## 2018-04-17 DIAGNOSIS — M9903 Segmental and somatic dysfunction of lumbar region: Secondary | ICD-10-CM | POA: Diagnosis not present

## 2018-04-17 DIAGNOSIS — M5441 Lumbago with sciatica, right side: Secondary | ICD-10-CM | POA: Diagnosis not present

## 2018-04-17 DIAGNOSIS — M9905 Segmental and somatic dysfunction of pelvic region: Secondary | ICD-10-CM | POA: Diagnosis not present

## 2018-04-24 DIAGNOSIS — M5441 Lumbago with sciatica, right side: Secondary | ICD-10-CM | POA: Diagnosis not present

## 2018-04-24 DIAGNOSIS — M9903 Segmental and somatic dysfunction of lumbar region: Secondary | ICD-10-CM | POA: Diagnosis not present

## 2018-04-24 DIAGNOSIS — M9902 Segmental and somatic dysfunction of thoracic region: Secondary | ICD-10-CM | POA: Diagnosis not present

## 2018-04-24 DIAGNOSIS — M9905 Segmental and somatic dysfunction of pelvic region: Secondary | ICD-10-CM | POA: Diagnosis not present

## 2018-04-28 DIAGNOSIS — M9905 Segmental and somatic dysfunction of pelvic region: Secondary | ICD-10-CM | POA: Diagnosis not present

## 2018-04-28 DIAGNOSIS — M9902 Segmental and somatic dysfunction of thoracic region: Secondary | ICD-10-CM | POA: Diagnosis not present

## 2018-04-28 DIAGNOSIS — M9903 Segmental and somatic dysfunction of lumbar region: Secondary | ICD-10-CM | POA: Diagnosis not present

## 2018-04-28 DIAGNOSIS — M5441 Lumbago with sciatica, right side: Secondary | ICD-10-CM | POA: Diagnosis not present

## 2018-05-04 DIAGNOSIS — M9903 Segmental and somatic dysfunction of lumbar region: Secondary | ICD-10-CM | POA: Diagnosis not present

## 2018-05-04 DIAGNOSIS — M5441 Lumbago with sciatica, right side: Secondary | ICD-10-CM | POA: Diagnosis not present

## 2018-05-04 DIAGNOSIS — M9905 Segmental and somatic dysfunction of pelvic region: Secondary | ICD-10-CM | POA: Diagnosis not present

## 2018-05-04 DIAGNOSIS — M9902 Segmental and somatic dysfunction of thoracic region: Secondary | ICD-10-CM | POA: Diagnosis not present

## 2018-05-08 DIAGNOSIS — M9905 Segmental and somatic dysfunction of pelvic region: Secondary | ICD-10-CM | POA: Diagnosis not present

## 2018-05-08 DIAGNOSIS — M9902 Segmental and somatic dysfunction of thoracic region: Secondary | ICD-10-CM | POA: Diagnosis not present

## 2018-05-08 DIAGNOSIS — M9903 Segmental and somatic dysfunction of lumbar region: Secondary | ICD-10-CM | POA: Diagnosis not present

## 2018-05-08 DIAGNOSIS — M5441 Lumbago with sciatica, right side: Secondary | ICD-10-CM | POA: Diagnosis not present

## 2018-05-11 DIAGNOSIS — M9902 Segmental and somatic dysfunction of thoracic region: Secondary | ICD-10-CM | POA: Diagnosis not present

## 2018-05-11 DIAGNOSIS — M5441 Lumbago with sciatica, right side: Secondary | ICD-10-CM | POA: Diagnosis not present

## 2018-05-11 DIAGNOSIS — M9905 Segmental and somatic dysfunction of pelvic region: Secondary | ICD-10-CM | POA: Diagnosis not present

## 2018-05-11 DIAGNOSIS — M9903 Segmental and somatic dysfunction of lumbar region: Secondary | ICD-10-CM | POA: Diagnosis not present

## 2018-05-12 NOTE — Progress Notes (Signed)
Devon Carter [address] [place], [place] [address] Phone: [phone] Subjective:    I Devon Carter am serving as a Neurosurgeon for Dr. Antoine Carter.     CC: Leg pain right greater than left and low back  BJY:[id number]  Devon Carter is a 47 y.o. male coming in with complaint of right leg pain. Lower back is painful as well as the hamstrings. Hamstring is tight. Stretches. All pain is right sided. No numbness and tingling noted.   Onset- Chronic been going on 5 months Location- Right lower back, calf, hamstring  Character-dull throbbing aching Aggravating factors-working out for long time or repetitive activity Therapies tried- Massage  Severity-6 out of 10     Past Medical History:  Diagnosis Date  . Chronic kidney disease    kidney stones  . GERD (gastroesophageal reflux disease)   . Seizures (HCC)    grand mal, age 64  No medication since age 44 and no recurrent seizure.  . Vitamin B 12 deficiency   . Vitamin D deficiency disease    Past Surgical History:  Procedure Laterality Date  . APPENDECTOMY  1994  . HERNIA REPAIR  2002, 2010   Social History   Socioeconomic History  . Marital status: Married    Spouse name: Not on file  . Number of children: Not on file  . Years of education: Not on file  . Highest education level: Not on file  Occupational History  . Not on file  Social Needs  . Financial resource strain: Not on file  . Food insecurity:    Worry: Not on file    Inability: Not on file  . Transportation needs:    Medical: Not on file    Non-medical: Not on file  Tobacco Use  . Smoking status: Never Smoker  . Smokeless tobacco: Never Used  Substance and Sexual Activity  . Alcohol use: Not on file  . Drug use: Not on file  . Sexual activity: Not on file  Lifestyle  . Physical activity:    Days per week: Not on file    Minutes per session: Not on file  . Stress: Not on file  Relationships  . Social connections:     Talks on phone: Not on file    Gets together: Not on file    Attends religious service: Not on file    Active member of club or organization: Not on file    Attends meetings of clubs or organizations: Not on file    Relationship status: Not on file  Other Topics Concern  . Not on file  Social History Narrative  . Not on file   Allergies  Allergen Reactions  . Percocet [Oxycodone-Acetaminophen] Nausea Only  . Celebrex [Celecoxib] Rash   Family History  Problem Relation Age of Onset  . Arthritis Mother   . Cancer Father        prostate, esophygeal  . Heart disease Father        CHF ? etiology    Current Outpatient Medications (Endocrine & Metabolic):  .  TESTOSTERONE IM, Inject 200 mLs into the muscle. He recieves 1 & 3/4 Ml once monthy. .  predniSONE (DELTASONE) 50 MG tablet, Take 1 tablet (50 mg total) by mouth daily.     Current Outpatient Medications (Hematological):  .  vitamin B-12 (CYANOCOBALAMIN) 1000 MCG tablet, Take 1,000 mcg by mouth 3 (three) times a week.  Current Outpatient Medications (Other):  Marland Kitchen  Cholecalciferol (  VITAMIN D3) 1000 UNITS CAPS, Take 1,000 capsules by mouth daily. Reported on 09/08/2015 .  Lactobacillus (ACIDOPHILUS PROBIOTIC) 10 MG TABS, Take 2 tablets by mouth daily. .  ondansetron (ZOFRAN ODT) 8 MG disintegrating tablet, Take 1 tablet (8 mg total) by mouth every 8 (eight) hours as needed for nausea or vomiting. Marland Kitchen  oseltamivir (TAMIFLU) 75 MG capsule, Take 1 capsule (75 mg total) by mouth 2 (two) times daily. .  Vitamin D, Ergocalciferol, (DRISDOL) 50000 units CAPS capsule, Take 1 capsule (50,000 Units total) by mouth every 7 (seven) days. Marland Kitchen  gabapentin (NEURONTIN) 100 MG capsule, Take 2 capsules (200 mg total) by mouth at bedtime.    Past medical history, social, surgical and family history all reviewed in electronic medical record.  No pertanent information unless stated regarding to the chief complaint.   Review of Systems:  No  headache, visual changes, nausea, vomiting, diarrhea, constipation, dizziness, abdominal pain, skin rash, fevers, chills, night sweats, weight loss, swollen lymph nodes, body aches, joint swelling, chest pain, shortness of breath, mood changes.  Positive muscle aches  Objective  Blood pressure 124/80, pulse 66, height 6' (1.829 m), weight 179 lb (81.2 kg), SpO2 97 %.    General: No apparent distress alert and oriented x3 mood and affect normal, dressed appropriately.  HEENT: Pupils equal, extraocular movements intact  Respiratory: Patient's speak in full sentences and does not appear short of breath  Cardiovascular: No lower extremity edema, non tender, no erythema  Skin: Warm dry intact with no signs of infection or rash on extremities or on axial skeleton.  Abdomen: Soft nontender  Neuro: Cranial nerves II through XII are intact, neurovascularly intact in all extremities with 2+ DTRs and 2+ pulses.  Lymph: No lymphadenopathy of posterior or anterior cervical chain or axillae bilaterally.  Gait normal with good balance and coordination.  MSK:  Non tender with full range of motion and good stability and symmetric strength and tone of shoulders, elbows, wrist, hip, knee and ankles bilaterally.  Back Exam:  Inspection: Mild loss of lordosis Motion: Flexion 45 deg, Extension 25 deg, Side Bending to 35 deg bilaterally,  Rotation to 45 deg bilaterally  SLR laying: Negative severe tightness of the hamstrings bilaterally XSLR laying: Negative  Palpable tenderness: Tender over the paraspinal musculature on the right side of the right sacroiliac joint. FABER: negative. Sensory change: Gross sensation intact to all lumbar and sacral dermatomes.  Reflexes: 2+ at both patellar tendons, 2+ at achilles tendons, Babinski's downgoing.  Strength at foot  Plantar-flexion: 5/5 Dorsi-flexion: 5/5 Eversion: 5/5 Inversion: 5/5  Leg strength  Quad: 5/5 Hamstring: 5/5 Hip flexor: 5/5 Hip abductors: 5/5  Gait  unremarkable.  [id number]; 15 additional minutes spent for Therapeutic exercises as stated in above notes.  This included exercises focusing on stretching, strengthening, with significant focus on eccentric aspects.   Long term goals include an improvement in range of motion, strength, endurance as well as avoiding reinjury. Patient's frequency would include in 1-2 times a day, 3-5 times a week for a duration of 6-12 weeks. Low back exercises that included:  Pelvic tilt/bracing instruction to focus on control of the pelvic girdle and lower abdominal muscles  Glute strengthening exercises, focusing on proper firing of the glutes without engaging the low back muscles Proper stretching techniques for maximum relief for the hamstrings, hip flexors, low back and some rotation where tolerated   Proper technique shown and discussed handout in great detail with ATC.  All questions were discussed and answered.  Impression and Recommendations:     This case required medical decision making of moderate complexity. The above documentation has been reviewed and is accurate and complete Devon Pulley, DO       Note: This dictation was prepared with Dragon dictation along with smaller phrase technology. Any transcriptional errors that result from this process are unintentional.

## 2018-05-13 ENCOUNTER — Ambulatory Visit: Payer: BLUE CROSS/BLUE SHIELD | Admitting: Family Medicine

## 2018-05-13 ENCOUNTER — Encounter: Payer: Self-pay | Admitting: Family Medicine

## 2018-05-13 ENCOUNTER — Ambulatory Visit (INDEPENDENT_AMBULATORY_CARE_PROVIDER_SITE_OTHER)
Admission: RE | Admit: 2018-05-13 | Discharge: 2018-05-13 | Disposition: A | Discharge status: Home / Self Care | Payer: BLUE CROSS/BLUE SHIELD | Source: Ambulatory Visit | Attending: Family Medicine | Admitting: Family Medicine

## 2018-05-13 VITALS — BP 124/80 | HR 66 | Ht 72.0 in | Wt 179.0 lb

## 2018-05-13 DIAGNOSIS — M545 Low back pain, unspecified: Secondary | ICD-10-CM | POA: Insufficient documentation

## 2018-05-13 DIAGNOSIS — G8929 Other chronic pain: Secondary | ICD-10-CM

## 2018-05-13 MED ORDER — PREDNISONE 50 MG PO TABS: 50.0000 mg | ORAL_TABLET | Freq: Every day | ORAL | 0 refills | Status: DC

## 2018-05-13 MED ORDER — GABAPENTIN 100 MG PO CAPS: 200.0000 mg | ORAL_CAPSULE | Freq: Every day | ORAL | 3 refills | Status: DC

## 2018-05-13 NOTE — Assessment & Plan Note (Signed)
Low back pain.  Significant tightness noted today.  Patient does have tightness of the hamstrings bilaterally right greater than left as well.  Concern for this being more radicular symptoms.  Gabapentin, and prednisone given, x-rays pending.  Home exercise given.  Follow-up again in 2-3 weeks

## 2018-05-13 NOTE — Patient Instructions (Addendum)
Great to see you again  Devon Carter is your friend Stay active but more biking or low impact  Exercises 3 times a week.   Prednisone daily for 5 days  Gabapentin 200mg  at night Xrays downstairs See me again in 2-3 weeks

## 2018-06-01 ENCOUNTER — Ambulatory Visit: Payer: BLUE CROSS/BLUE SHIELD | Admitting: Family Medicine

## 2018-06-01 DIAGNOSIS — M25571 Pain in right ankle and joints of right foot: Secondary | ICD-10-CM | POA: Diagnosis not present

## 2018-06-04 DIAGNOSIS — K219 Gastro-esophageal reflux disease without esophagitis: Secondary | ICD-10-CM | POA: Diagnosis not present

## 2018-06-04 DIAGNOSIS — K449 Diaphragmatic hernia without obstruction or gangrene: Secondary | ICD-10-CM | POA: Diagnosis not present

## 2018-06-04 DIAGNOSIS — R140 Abdominal distension (gaseous): Secondary | ICD-10-CM | POA: Diagnosis not present

## 2018-06-04 DIAGNOSIS — Z800 Family history of malignant neoplasm of digestive organs: Secondary | ICD-10-CM | POA: Diagnosis not present

## 2018-06-05 DIAGNOSIS — N200 Calculus of kidney: Secondary | ICD-10-CM | POA: Diagnosis not present

## 2018-06-05 DIAGNOSIS — E349 Endocrine disorder, unspecified: Secondary | ICD-10-CM | POA: Diagnosis not present

## 2018-08-16 NOTE — Progress Notes (Signed)
[facility] [address] [place], [place] [address] Phone: [phone] Subjective:   I Devon Carter am serving as a Neurosurgeon for Dr. Antoine Carter.   CC: hamstring injury.    IBB:[id number]    05/13/18 Low back pain.  Significant tightness noted today.  Patient does have tightness of the hamstrings bilaterally right greater than left as well.  Concern for this being more radicular symptoms.    At that time patient was given gabapentin and prednisone  Updated 08/17/2018  Devon Carter is a 48 y.o. male coming in with complaint of back pain. States that his back pain is about the same. Pain went away while on prednisone. Lower right back.  Came back again.  Patient has been able to workout just not as intensely as like he usually does.    X-rays taken on May 14, 2018 were independently visualized by me.  Lumbar x-rays show mild kidney stones on the left side but otherwise unremarkable. 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  Left side had a superior labral tear Past Medical History:  Diagnosis Date  . Chronic kidney disease    kidney stones  . GERD (gastroesophageal reflux disease)   . Seizures (HCC)    grand mal, age 25  No medication since age 65 and no recurrent seizure.  . Vitamin B 12 deficiency   . Vitamin D deficiency disease    Past Surgical History:  Procedure Laterality Date  . APPENDECTOMY  1994  . HERNIA REPAIR  2002, 2010   Social History   Socioeconomic History  . Marital status: Married    Spouse name: Not on file  . Number of children: Not on file  . Years of education: Not on file  . Highest education level: Not on file  Occupational History  . Not on file  Social Needs  . Financial resource strain: Not on file  . Food insecurity:    Worry: Not on file    Inability: Not on file  . Transportation needs:    Medical: Not on file    Non-medical: Not on file  Tobacco Use  . Smoking status:  Never Smoker  . Smokeless tobacco: Never Used  Substance and Sexual Activity  . Alcohol use: Not on file  . Drug use: Not on file  . Sexual activity: Not on file  Lifestyle  . Physical activity:    Days per week: Not on file    Minutes per session: Not on file  . Stress: Not on file  Relationships  . Social connections:    Talks on phone: Not on file    Gets together: Not on file    Attends religious service: Not on file    Active member of club or organization: Not on file    Attends meetings of clubs or organizations: Not on file    Relationship status: Not on file  Other Topics Concern  . Not on file  Social History Narrative  . Not on file   Allergies  Allergen Reactions  . Percocet [Oxycodone-Acetaminophen] Nausea Only  . Celebrex [Celecoxib] Rash   Family History  Problem Relation Age of Onset  . Arthritis Mother   . Cancer Father        prostate, esophygeal  . Heart disease Father        CHF ? etiology    Current Outpatient Medications (Endocrine & Metabolic):  .  predniSONE (DELTASONE) 50  MG tablet, Take 1 tablet (50 mg total) by mouth daily. .  TESTOSTERONE IM, Inject 200 mLs into the muscle. He recieves 1 & 3/4 Ml once monthy.     Current Outpatient Medications (Hematological):  .  vitamin B-12 (CYANOCOBALAMIN) 1000 MCG tablet, Take 1,000 mcg by mouth 3 (three) times a week.  Current Outpatient Medications (Other):  Marland Kitchen  Cholecalciferol (VITAMIN D3) 1000 UNITS CAPS, Take 1,000 capsules by mouth daily. Reported on 09/08/2015 .  gabapentin (NEURONTIN) 100 MG capsule, Take 2 capsules (200 mg total) by mouth at bedtime. .  Lactobacillus (ACIDOPHILUS PROBIOTIC) 10 MG TABS, Take 2 tablets by mouth daily. .  ondansetron (ZOFRAN ODT) 8 MG disintegrating tablet, Take 1 tablet (8 mg total) by mouth every 8 (eight) hours as needed for nausea or vomiting. Marland Kitchen  oseltamivir (TAMIFLU) 75 MG capsule, Take 1 capsule (75 mg total) by mouth 2 (two) times daily. .  Vitamin D,  Ergocalciferol, (DRISDOL) 50000 units CAPS capsule, Take 1 capsule (50,000 Units total) by mouth every 7 (seven) days.    Past medical history, social, surgical and family history all reviewed in electronic medical record.  No pertanent information unless stated regarding to the chief complaint.   Review of Systems:  No headache, visual changes, nausea, vomiting, diarrhea, constipation, dizziness, abdominal pain, skin rash, fevers, chills, night sweats, weight loss, swollen lymph nodes, body aches, joint swelling,  chest pain, shortness of breath, mood changes.  Positive muscle aches  Objective  Blood pressure 120/82, pulse 71, height 6' (1.829 m), weight 187 lb (84.8 kg), SpO2 97 %.    General: No apparent distress alert and oriented x3 mood and affect normal, dressed appropriately.  HEENT: Pupils equal, extraocular movements intact  Respiratory: Patient's speak in full sentences and does not appear short of breath  Cardiovascular: No lower extremity edema, non tender, no erythema  Skin: Warm dry intact with no signs of infection or rash on extremities or on axial skeleton.  Abdomen: Soft nontender  Neuro: Cranial nerves II through XII are intact, neurovascularly intact in all extremities with 2+ DTRs and 2+ pulses.  Lymph: No lymphadenopathy of posterior or anterior cervical chain or axillae bilaterally.  Gait normal with good balance and coordination.  MSK:  Non tender with full range of motion and good stability and symmetric strength and tone of shoulders, elbows, wrist, hip, knee and ankles bilaterally.  Back Exam:  Inspection: Mild loss of lordosis Motion: Flexion 45 deg, Extension 25 deg, Side Bending to 35 deg bilaterally, Rotation to 35 deg bilaterally  SLR laying: Negative hamstrings bilaterally right greater than left XSLR laying: Negative  Palpable tenderness: Tender to palpation the paraspinal musculature.Marland Kitchen FABER: Positive tightness bilaterally. Sensory change: Gross  sensation intact to all lumbar and sacral dermatomes.  Reflexes: 2+ at both patellar tendons, 2+ at achilles tendons, Babinski's downgoing.  Strength at foot  Plantar-flexion: 5/5 Dorsi-flexion: 5/5 Eversion: 5/5 Inversion: 5/5  Leg strength  Quad: 5/5 Hamstring: 5/5 Hip flexor: 5/5 Hip abductors: 5/5    Osteopathic findings T6 extended rotated and side bent left L3 flexed rotated and side bent right Sacrum right on right    Impression and Recommendations:     This case required medical decision making of moderate complexity. The above documentation has been reviewed and is accurate and complete Judi Saa, DO       Note: This dictation was prepared with Dragon dictation along with smaller phrase technology. Any transcriptional errors that result from this process are unintentional.

## 2018-08-17 ENCOUNTER — Encounter: Payer: Self-pay | Admitting: Family Medicine

## 2018-08-17 ENCOUNTER — Ambulatory Visit: Payer: BLUE CROSS/BLUE SHIELD | Admitting: Family Medicine

## 2018-08-17 VITALS — BP 120/82 | HR 71 | Ht 72.0 in | Wt 187.0 lb

## 2018-08-17 DIAGNOSIS — M545 Low back pain, unspecified: Secondary | ICD-10-CM

## 2018-08-17 DIAGNOSIS — M999 Biomechanical lesion, unspecified: Secondary | ICD-10-CM | POA: Insufficient documentation

## 2018-08-17 MED ORDER — GABAPENTIN 100 MG PO CAPS: 200.0000 mg | ORAL_CAPSULE | Freq: Every day | ORAL | 3 refills | Status: DC

## 2018-08-17 NOTE — Patient Instructions (Addendum)
Good to see you  Ice is your friend Try the colchicine 2 times a day for 3 days Keep trucking along  Talala manipulation  If worsening we will consider MRI  Gabapentin 200mg  at night See me again in 4 weeks

## 2018-08-17 NOTE — Assessment & Plan Note (Signed)
Still believe likely more of a radicular symptoms.  Attempted osteopathic manipulation.  Patient responded fairly well to this.  Encourage patient to try the gabapentin which she did not last time.  We discussed hip abductor strengthening, possible MRI.  Patient will follow-up again in 4 weeks

## 2018-08-17 NOTE — Assessment & Plan Note (Signed)
Decision today to treat with OMT was based on Physical Exam  After verbal consent patient was treated with HVLA, ME, FPR techniques in  thoracic, lumbar and sacral areas  Patient tolerated the procedure well with improvement in symptoms  Patient given exercises, stretches and lifestyle modifications  See medications in patient instructions if given  Patient will follow up in 4-8 weeks 

## 2018-08-20 ENCOUNTER — Other Ambulatory Visit: Payer: Self-pay | Admitting: Cardiovascular Disease

## 2018-08-20 MED ORDER — OSELTAMIVIR PHOSPHATE 75 MG PO CAPS: 75.0000 mg | ORAL_CAPSULE | Freq: Two times a day (BID) | ORAL | 1 refills | Status: DC

## 2018-08-20 MED FILL — OSELTAMIVIR PHOS 75 MG CAP: 75 | 5 days supply | Qty: 10 | Fill #0

## 2018-08-21 ENCOUNTER — Encounter: Payer: Self-pay | Admitting: Family Medicine

## 2018-08-22 MED ORDER — COLCHICINE 0.6 MG PO TABS: 0.6000 mg | ORAL_TABLET | Freq: Two times a day (BID) | ORAL | 3 refills | Status: DC

## 2018-09-03 ENCOUNTER — Encounter: Payer: Self-pay | Admitting: Family Medicine

## 2018-09-03 ENCOUNTER — Other Ambulatory Visit: Payer: Self-pay

## 2018-09-03 DIAGNOSIS — M4807 Spinal stenosis, lumbosacral region: Secondary | ICD-10-CM

## 2018-09-03 DIAGNOSIS — M549 Dorsalgia, unspecified: Secondary | ICD-10-CM

## 2018-09-03 NOTE — Progress Notes (Unsigned)
amb  

## 2018-09-08 ENCOUNTER — Encounter: Payer: Self-pay | Admitting: Family Medicine

## 2018-09-08 DIAGNOSIS — R5383 Other fatigue: Secondary | ICD-10-CM | POA: Diagnosis not present

## 2018-09-08 DIAGNOSIS — E291 Testicular hypofunction: Secondary | ICD-10-CM | POA: Diagnosis not present

## 2018-09-08 DIAGNOSIS — R7989 Other specified abnormal findings of blood chemistry: Secondary | ICD-10-CM | POA: Diagnosis not present

## 2018-09-10 ENCOUNTER — Ambulatory Visit
Admission: RE | Admit: 2018-09-10 | Discharge: 2018-09-10 | Disposition: A | Discharge status: Home / Self Care | Payer: BLUE CROSS/BLUE SHIELD | Source: Ambulatory Visit | Attending: Family Medicine | Admitting: Family Medicine

## 2018-09-10 DIAGNOSIS — M5416 Radiculopathy, lumbar region: Secondary | ICD-10-CM | POA: Diagnosis not present

## 2018-09-10 DIAGNOSIS — M4807 Spinal stenosis, lumbosacral region: Secondary | ICD-10-CM

## 2018-09-11 ENCOUNTER — Encounter: Payer: Self-pay | Admitting: Family Medicine

## 2018-09-16 ENCOUNTER — Ambulatory Visit: Payer: BLUE CROSS/BLUE SHIELD | Admitting: Family Medicine

## 2018-09-16 DIAGNOSIS — E559 Vitamin D deficiency, unspecified: Secondary | ICD-10-CM | POA: Diagnosis not present

## 2018-09-16 DIAGNOSIS — M549 Dorsalgia, unspecified: Secondary | ICD-10-CM | POA: Diagnosis not present

## 2018-09-16 DIAGNOSIS — R7989 Other specified abnormal findings of blood chemistry: Secondary | ICD-10-CM | POA: Diagnosis not present

## 2018-09-17 DIAGNOSIS — M549 Dorsalgia, unspecified: Secondary | ICD-10-CM | POA: Diagnosis not present

## 2019-02-03 DIAGNOSIS — L738 Other specified follicular disorders: Secondary | ICD-10-CM | POA: Diagnosis not present

## 2019-02-03 DIAGNOSIS — D2261 Melanocytic nevi of right upper limb, including shoulder: Secondary | ICD-10-CM | POA: Diagnosis not present

## 2019-02-03 DIAGNOSIS — D224 Melanocytic nevi of scalp and neck: Secondary | ICD-10-CM | POA: Diagnosis not present

## 2019-02-03 DIAGNOSIS — D225 Melanocytic nevi of trunk: Secondary | ICD-10-CM | POA: Diagnosis not present

## 2019-02-09 ENCOUNTER — Emergency Department (HOSPITAL_COMMUNITY)
Admission: EM | Admit: 2019-02-09 | Discharge: 2019-02-09 | Disposition: A | Discharge status: Home / Self Care | Payer: BC Managed Care – PPO | Attending: Emergency Medicine | Admitting: Emergency Medicine

## 2019-02-09 ENCOUNTER — Encounter (HOSPITAL_COMMUNITY): Payer: Self-pay

## 2019-02-09 ENCOUNTER — Emergency Department (HOSPITAL_COMMUNITY): Payer: BC Managed Care – PPO

## 2019-02-09 ENCOUNTER — Other Ambulatory Visit: Payer: Self-pay

## 2019-02-09 DIAGNOSIS — Z79899 Other long term (current) drug therapy: Secondary | ICD-10-CM | POA: Diagnosis not present

## 2019-02-09 DIAGNOSIS — R1032 Left lower quadrant pain: Secondary | ICD-10-CM | POA: Diagnosis not present

## 2019-02-09 DIAGNOSIS — N200 Calculus of kidney: Secondary | ICD-10-CM | POA: Insufficient documentation

## 2019-02-09 DIAGNOSIS — N132 Hydronephrosis with renal and ureteral calculous obstruction: Secondary | ICD-10-CM | POA: Diagnosis not present

## 2019-02-09 LAB — BASIC METABOLIC PANEL
Anion gap: 8 (ref 5–15)
BUN: 21 mg/dL — ABNORMAL HIGH (ref 6–20)
CO2: 25 mmol/L (ref 22–32)
Calcium: 8.9 mg/dL (ref 8.9–10.3)
Chloride: 102 mmol/L (ref 98–111)
Creatinine, Ser: 1.27 mg/dL — ABNORMAL HIGH (ref 0.61–1.24)
GFR calc Af Amer: >60 mL/min (ref >60)
GFR calc non Af Amer: >60 mL/min (ref >60)
Glucose, Bld: 99 mg/dL (ref 70–99)
Potassium: 5 mmol/L (ref 3.5–5.1)
Sodium: 135 mmol/L (ref 135–145)

## 2019-02-09 LAB — URINALYSIS, ROUTINE W REFLEX MICROSCOPIC
Bacteria, UA: NONE SEEN
Bilirubin Urine: NEGATIVE
Glucose, UA: NEGATIVE mg/dL
Ketones, ur: 5 mg/dL — AB
Leukocytes,Ua: NEGATIVE
Nitrite: NEGATIVE
Protein, ur: NEGATIVE mg/dL
RBC / HPF: >50 RBC/hpf — ABNORMAL HIGH (ref 0–5)
Specific Gravity, Urine: 1.021 (ref 1.005–1.030)
pH: 6 (ref 5.0–8.0)

## 2019-02-09 LAB — CBC
HCT: 49.5 % (ref 39.0–52.0)
Hemoglobin: 16.7 g/dL (ref 13.0–17.0)
MCH: 31.5 pg (ref 26.0–34.0)
MCHC: 33.7 g/dL (ref 30.0–36.0)
MCV: 93.4 fL (ref 80.0–100.0)
Platelets: 165 10*3/uL (ref 150–400)
RBC: 5.3 MIL/uL (ref 4.22–5.81)
RDW: 12 % (ref 11.5–15.5)
WBC: 9.2 10*3/uL (ref 4.0–10.5)
nRBC: 0 % (ref 0.0–0.2)

## 2019-02-09 MED ORDER — FAMOTIDINE IN NACL 20-0.9 MG/50ML-% IV SOLN
20.0000 mg | Freq: Once | INTRAVENOUS | Status: AC
  Administered 2019-02-09: 20 mg via INTRAVENOUS
  Filled 2019-02-09: qty 50

## 2019-02-09 MED ORDER — SODIUM CHLORIDE 0.9 % IV BOLUS
1000.0000 mL | Freq: Once | INTRAVENOUS | Status: AC
  Administered 2019-02-09: 12:00:00 1000 mL via INTRAVENOUS

## 2019-02-09 MED ORDER — KETOROLAC TROMETHAMINE 15 MG/ML IJ SOLN
15.0000 mg | Freq: Once | INTRAMUSCULAR | Status: AC
  Administered 2019-02-09: 11:00:00 15 mg via INTRAVENOUS
  Filled 2019-02-09: qty 1

## 2019-02-09 MED ORDER — ONDANSETRON 4 MG PO TBDP: 4.0000 mg | ORAL_TABLET | Freq: Three times a day (TID) | ORAL | 0 refills | Status: DC | PRN

## 2019-02-09 MED ORDER — ONDANSETRON HCL 4 MG/2ML IJ SOLN
4.0000 mg | Freq: Once | INTRAMUSCULAR | Status: AC
Start: 2019-02-09 — End: 2019-02-09
  Administered 2019-02-09: 09:00:00 4 mg via INTRAVENOUS
  Filled 2019-02-09: qty 2

## 2019-02-09 MED ORDER — HYDROCODONE-ACETAMINOPHEN 5-325 MG PO TABS: 1.0000 | ORAL_TABLET | ORAL | 0 refills | Status: DC | PRN

## 2019-02-09 MED ORDER — HYDROMORPHONE HCL 1 MG/ML IJ SOLN
1.0000 mg | Freq: Once | INTRAMUSCULAR | Status: AC
  Administered 2019-02-09: 10:00:00 1 mg via INTRAVENOUS
  Filled 2019-02-09: qty 1

## 2019-02-09 MED ORDER — TAMSULOSIN HCL 0.4 MG PO CAPS: 0.4000 mg | ORAL_CAPSULE | Freq: Every day | ORAL | 0 refills | Status: DC

## 2019-02-09 MED ORDER — MORPHINE SULFATE (PF) 4 MG/ML IV SOLN
4.0000 mg | Freq: Once | INTRAVENOUS | Status: AC
  Administered 2019-02-09: 4 mg via INTRAVENOUS
  Filled 2019-02-09: qty 1

## 2019-02-09 MED ORDER — SODIUM CHLORIDE 0.9 % IV BOLUS
1000.0000 mL | Freq: Once | INTRAVENOUS | Status: AC
  Administered 2019-02-09: 09:00:00 1000 mL via INTRAVENOUS

## 2019-02-09 MED ORDER — HYDROMORPHONE HCL 1 MG/ML IJ SOLN
1.0000 mg | Freq: Once | INTRAMUSCULAR | Status: AC
  Administered 2019-02-09: 12:00:00 1 mg via INTRAVENOUS
  Filled 2019-02-09: qty 1

## 2019-02-09 NOTE — Discharge Instructions (Addendum)
You were seen in the emergency department and found to have a kidney stone.  We are sending you home with multiple medications to help with symptoms and to assist with passing the stone:   -Flomax-this is a medication to help pass the stone, it allows urine to exit the body more freely.  Please take this once daily with a meal.  -Norco-this is a narcotic/controlled substance medication that has potential addicting qualities.  We recommend that you take 1-2 tablets every 6 hours as needed for severe pain.  Do not drive or operate heavy machinery when taking this medicine as it can be sedating. Do not drink alcohol or take other sedating medications when taking this medicine for safety reasons.  Keep this out of reach of small children.  Please be aware this medicine has Tylenol in it (325 mg/tab) do not exceed the maximum dose of Tylenol in a day per over the counter recommendations should you decide to supplement with Tylenol over the counter.   -Zofran-this is an antinausea medication, you may take this every 8 hours as needed for nausea and vomiting, please allow the tablet to dissolve underneath of your tongue.   We have prescribed you new medication(s) today. Discuss the medications prescribed today with your pharmacist as they can have adverse effects and interactions with your other medicines including over the counter and prescribed medications. Seek medical evaluation if you start to experience new or abnormal symptoms after taking one of these medicines, seek care immediately if you start to experience difficulty breathing, feeling of your throat closing, facial swelling, or rash as these could be indications of a more serious allergic reaction  Your kidney stone is 70mm in the distal ureter.  Your kidney function (creatinine) was mildly elevated at 1.29- this is likely related to the kidney stone but should be rechecked within 1 week.   Please follow-up with the urology group provided in your  discharge instructions within 3 to 5 days.  Return to the ER for new or worsening symptoms including but not limited to worsening pain not controlled by these medicines, inability to keep fluids down, fever, or any other concerns that you may have.  Results for orders placed or performed during the hospital encounter of 02/09/19  Urinalysis, Routine w reflex microscopic- may I&O cath if menses  Result Value Ref Range   Color, Urine YELLOW YELLOW   APPearance CLEAR CLEAR   Specific Gravity, Urine 1.021 1.005 - 1.030   pH 6.0 5.0 - 8.0   Glucose, UA NEGATIVE NEGATIVE mg/dL   Hgb urine dipstick LARGE (A) NEGATIVE   Bilirubin Urine NEGATIVE NEGATIVE   Ketones, ur 5 (A) NEGATIVE mg/dL   Protein, ur NEGATIVE NEGATIVE mg/dL   Nitrite NEGATIVE NEGATIVE   Leukocytes,Ua NEGATIVE NEGATIVE   RBC / HPF >50 (H) 0 - 5 RBC/hpf   WBC, UA 0-5 0 - 5 WBC/hpf   Bacteria, UA NONE SEEN NONE SEEN   Mucus PRESENT   CBC  Result Value Ref Range   WBC 9.2 4.0 - 10.5 K/uL   RBC 5.30 4.22 - 5.81 MIL/uL   Hemoglobin 16.7 13.0 - 17.0 g/dL   HCT 49.5 39.0 - 52.0 %   MCV 93.4 80.0 - 100.0 fL   MCH 31.5 26.0 - 34.0 pg   MCHC 33.7 30.0 - 36.0 g/dL   RDW 12.0 11.5 - 15.5 %   Platelets 165 150 - 400 K/uL   nRBC 0.0 0.0 - 0.2 %  Basic metabolic  panel  Result Value Ref Range   Sodium 135 135 - 145 mmol/L   Potassium 5.0 3.5 - 5.1 mmol/L   Chloride 102 98 - 111 mmol/L   CO2 25 22 - 32 mmol/L   Glucose, Bld 99 70 - 99 mg/dL   BUN 21 (H) 6 - 20 mg/dL   Creatinine, Ser 1.611.27 (H) 0.61 - 1.24 mg/dL   Calcium 8.9 8.9 - 09.610.3 mg/dL   GFR calc non Af Amer >60 >60 mL/min   GFR calc Af Amer >60 >60 mL/min   Anion gap 8 5 - 15   Ct Renal Stone Study  Result Date: 02/09/2019 CLINICAL DATA:  Acute onset left flank pain this morning. History of urinary tract stones. EXAM: CT ABDOMEN AND PELVIS WITHOUT CONTRAST TECHNIQUE: Multidetector CT imaging of the abdomen and pelvis was performed following the standard protocol without  IV contrast. COMPARISON:  CT abdomen and pelvis 11/22/2013. FINDINGS: Lower chest: Lung bases clear.  No pleural or pericardial effusion. Hepatobiliary: No focal liver abnormality is seen. No gallstones, gallbladder wall thickening, or biliary dilatation. Pancreas: Unremarkable. No pancreatic ductal dilatation or surrounding inflammatory changes. Spleen: Normal in size without focal abnormality. Adrenals/Urinary Tract: There is mild left hydronephrosis with stranding about the left kidney and ureter due to a 0.5 cm left ureteral stone just below the level of the left SI joint. The patient has multiple small nonobstructing left renal stones. Two small nonobstructing right renal stones are also noted. No urinary bladder stones. There is some scarring in the right kidney, unchanged. Adrenal glands appear normal. Stomach/Bowel: Stomach is within normal limits. Status post appendectomy. No evidence of bowel wall thickening, distention, or inflammatory changes. Vascular/Lymphatic: No significant vascular findings are present. No enlarged abdominal or pelvic lymph nodes. Reproductive: Prostate is unremarkable. Other: None. Musculoskeletal: Negative. IMPRESSION: Mild left hydronephrosis due to a 0.5 cm distal left ureteral stone. The patient has multiple small nonobstructing left renal stones and 2 small nonobstructing right renal stones. Electronically Signed   By: Drusilla Kannerhomas  Dalessio M.D.   On: 02/09/2019 09:51

## 2019-02-09 NOTE — ED Provider Notes (Signed)
[facility] DEPT Provider Note   CSN: [id number] Arrival date & time: 02/09/19  0747     History   Chief Complaint Chief Complaint  Patient presents with   Flank Pain    HPI Devon Carter is a 48 y.o. male with a history of nephrolithiasis, seizures, GERD, as well as prior appendectomy and hernia repair who presents to the emergency department with complaints of left flank pain that started at 0600 this AM.  Pain is located in the left flank and radiates to the left lower quadrant of the abdomen.  It waxes and wanes in severity.  He has had associated nausea with 3 episodes of nonbloody, nonbilious emesis.  He has not urinated yet this morning after onset of pain, but does not necessarily feel that he has to.  Denies fever, chills, chest pain, dyspnea, diarrhea, melena, hematochezia, hematuria, frequency, or dysuria.  He has a history of prior kidney stones, these have not required surgical procedures in the past, he does have 1 intra-renal stone that he knows is very large and is concerned that this is the stone that is causing his symptoms as his pain is much worse than it typically is.     HPI  Past Medical History:  Diagnosis Date   Chronic kidney disease    kidney stones   GERD (gastroesophageal reflux disease)    Seizures (HCC)    grand mal, age 33  No medication since age 46 and no recurrent seizure.   Vitamin B 12 deficiency    Vitamin D deficiency disease     Patient Active Problem List   Diagnosis Date Noted   Nonallopathic lesion of sacral region 08/17/2018   Nonallopathic lesion of lumbosacral region 08/17/2018   Nonallopathic lesion of thoracic region 08/17/2018   Lumbar back pain 05/13/2018   Injury of adductor muscle and tendon of right thigh 09/04/2016   Chest wall pain 10/20/2013   History of kidney stones 09/22/2012   History of tonic-clonic seizures 09/22/2012   GERD (gastroesophageal reflux disease) 09/22/2012    Unspecified vitamin D deficiency 09/22/2012   B12 deficiency 09/22/2012   Cavus deformity of foot 06/23/2012   Right Achilles tendinitis 06/23/2012    Past Surgical History:  Procedure Laterality Date   [place]  2002, 2010        Home Medications    Prior to Admission medications   Medication Sig Start Date End Date Taking? Authorizing Provider  Cholecalciferol (VITAMIN D3) 1000 UNITS CAPS Take 1,000 capsules by mouth daily. Reported on 09/08/2015    [provider]  colchicine 0.6 MG tablet Take 1 tablet (0.6 mg total) by mouth 2 (two) times daily for 5 days. 08/22/18 08/27/18  Lyndal Pulley, DO  gabapentin (NEURONTIN) 100 MG capsule Take 2 capsules (200 mg total) by mouth at bedtime. 08/17/18   Lyndal Pulley, DO  Lactobacillus (ACIDOPHILUS PROBIOTIC) 10 MG TABS Take 2 tablets by mouth daily.    [provider]  ondansetron (ZOFRAN ODT) 8 MG disintegrating tablet Take 1 tablet (8 mg total) by mouth every 8 (eight) hours as needed for nausea or vomiting. 08/30/14   Burchette, Alinda Sierras, MD  oseltamivir (TAMIFLU) 75 MG capsule Take 1 capsule (75 mg total) by mouth 2 (two) times daily. 08/20/18   Nahser, Wonda Cheng, MD  predniSONE (DELTASONE) 50 MG tablet Take 1 tablet (50 mg total) by mouth daily. 05/13/18   Lyndal Pulley, DO  TESTOSTERONE  IM Inject 200 mLs into the muscle. He recieves 1 & 3/4 Ml once monthy.    [provider]  vitamin B-12 (CYANOCOBALAMIN) 1000 MCG tablet Take 1,000 mcg by mouth 3 (three) times a week.    [provider]  Vitamin D, Ergocalciferol, (DRISDOL) 50000 units CAPS capsule Take 1 capsule (50,000 Units total) by mouth every 7 (seven) days. 03/10/17   Judi SaaSmith, Zachary M, DO    Family History Family History  Problem Relation Age of Onset   Arthritis Mother    Cancer Father        prostate, esophygeal   Heart disease Father        CHF ? etiology    Social History Social History    Tobacco Use   Smoking status: Never Smoker   Smokeless tobacco: Never Used  Substance Use Topics   Alcohol use: Not on file   Drug use: Not on file     Allergies   Percocet [oxycodone-acetaminophen] and Celebrex [celecoxib]   Review of Systems Review of Systems  Constitutional: Negative for chills and fever.  Respiratory: Negative for shortness of breath.   Cardiovascular: Negative for chest pain.  Gastrointestinal: Positive for abdominal pain, nausea and vomiting. Negative for blood in stool, constipation and diarrhea.  Genitourinary: Positive for flank pain. Negative for discharge, dysuria, frequency, hematuria, scrotal swelling and testicular pain.  All other systems reviewed and are negative.    Physical Exam Updated Vital Signs BP (!) 139/94 (BP Location: Right Arm)    Pulse 60    Temp 97.7 F (36.5 C) (Oral)    Resp 20    Ht 6' (1.829 m)    Wt 81.6 kg    SpO2 100%    BMI 24.41 kg/m   Physical Exam Vitals signs and nursing note reviewed.  Constitutional:      General: He is in acute distress (mild appears uncomfortable. ).     Appearance: He is well-developed. He is not toxic-appearing.  HENT:     Head: Normocephalic and atraumatic.  Eyes:     General:        Right eye: No discharge.        Left eye: No discharge.     Conjunctiva/sclera: Conjunctivae normal.  Neck:     Musculoskeletal: Neck supple.  Cardiovascular:     Rate and Rhythm: Normal rate and regular rhythm.  Pulmonary:     Effort: Pulmonary effort is normal. No respiratory distress.     Breath sounds: Normal breath sounds. No wheezing, rhonchi or rales.  Abdominal:     General: There is no distension.     Palpations: Abdomen is soft.     Tenderness: There is no abdominal tenderness. There is no right CVA tenderness, left CVA tenderness, guarding or rebound.  Skin:    General: Skin is warm and dry.     Findings: No rash.  Neurological:     Mental Status: He is alert.     Comments: Clear  speech.   Psychiatric:        Behavior: Behavior normal.      ED Treatments / Results  Labs (all labs ordered are listed, but only abnormal results are displayed) Labs Reviewed  URINALYSIS, ROUTINE W REFLEX MICROSCOPIC - Abnormal; Notable for the following components:      Result Value   Hgb urine dipstick LARGE (*)    Ketones, ur 5 (*)    RBC / HPF >50 (*)    All other components  within normal limits  BASIC METABOLIC PANEL - Abnormal; Notable for the following components:   BUN 21 (*)    Creatinine, Ser 1.27 (*)    All other components within normal limits  URINE CULTURE  CBC    EKG None  Radiology Ct Renal Stone Study  Result Date: 02/09/2019 CLINICAL DATA:  Acute onset left flank pain this morning. History of urinary tract stones. EXAM: CT ABDOMEN AND PELVIS WITHOUT CONTRAST TECHNIQUE: Multidetector CT imaging of the abdomen and pelvis was performed following the standard protocol without IV contrast. COMPARISON:  CT abdomen and pelvis 11/22/2013. FINDINGS: Lower chest: Lung bases clear.  No pleural or pericardial effusion. Hepatobiliary: No focal liver abnormality is seen. No gallstones, gallbladder wall thickening, or biliary dilatation. Pancreas: Unremarkable. No pancreatic ductal dilatation or surrounding inflammatory changes. Spleen: Normal in size without focal abnormality. Adrenals/Urinary Tract: There is mild left hydronephrosis with stranding about the left kidney and ureter due to a 0.5 cm left ureteral stone just below the level of the left SI joint. The patient has multiple small nonobstructing left renal stones. Two small nonobstructing right renal stones are also noted. No urinary bladder stones. There is some scarring in the right kidney, unchanged. Adrenal glands appear normal. Stomach/Bowel: Stomach is within normal limits. Status post appendectomy. No evidence of bowel wall thickening, distention, or inflammatory changes. Vascular/Lymphatic: No significant vascular  findings are present. No enlarged abdominal or pelvic lymph nodes. Reproductive: Prostate is unremarkable. Other: None. Musculoskeletal: Negative. IMPRESSION: Mild left hydronephrosis due to a 0.5 cm distal left ureteral stone. The patient has multiple small nonobstructing left renal stones and 2 small nonobstructing right renal stones. Electronically Signed   By: Drusilla Kannerhomas  Dalessio M.D.   On: 02/09/2019 09:51    Procedures Procedures (including critical care time)  Medications Ordered in ED Medications  sodium chloride 0.9 % bolus 1,000 mL (0 mLs Intravenous Stopped 02/09/19 1136)  morphine 4 MG/ML injection 4 mg (4 mg Intravenous Given 02/09/19 0916)  ondansetron (ZOFRAN) injection 4 mg (4 mg Intravenous Given 02/09/19 0916)  famotidine (PEPCID) IVPB 20 mg premix (0 mg Intravenous Stopped 02/09/19 1049)  HYDROmorphone (DILAUDID) injection 1 mg (1 mg Intravenous Given 02/09/19 1001)  ketorolac (TORADOL) 15 MG/ML injection 15 mg (15 mg Intravenous Given 02/09/19 1050)  sodium chloride 0.9 % bolus 1,000 mL (0 mLs Intravenous Stopped 02/09/19 1403)  HYDROmorphone (DILAUDID) injection 1 mg (1 mg Intravenous Given 02/09/19 1221)     Initial Impression / Assessment and Plan / ED Course  I have reviewed the triage vital signs and the nursing notes.  Pertinent labs & imaging results that were available during my care of the patient were reviewed by me and considered in my medical decision making (see chart for details).    Patient presents to the ED with complaints of flank/abdominal pain that began shortly PTA. Patient nontoxic appearing, vitals without significant abnormalities- BP mildly elevated- doubt HTN emergency. On exam patient has no abdominal or CVA tenderness, no peritoneal signs. DDX: nephrolithiasis, pyelonephritis/UTI, cholecystitis,  bowel obstruction/perforation, dissection, feel nephrolithiasis is most likely at this time, we discussed option of presumed tx for kidney stone vs. Imaging-  patient states given this pain is worse and he has not had many scans he would prefer imaging. Will evaluate with labs and CT renal study, analgesics, anti-emetics, and fluids ordered.   No leukocytosis, anemia, or significant electrolyte derangements. Creatinine mildly elevated from prior- will need recheck. UA w/ hematuria, does not appear consistent w/ superimposed infection.  CT renal study reveals mild left hydronephrosis due to a 0.5 cm distal left ureteral stone. The patient has multiple small nonobstructing left renal stones and 2 small nonobstructing right renal stones.    12:02: RE-EVAL: Patient initially w/ improvement in sxs, but then pain returned. Additional dose of dilaudid ordered.   13:40: RE-EVAL: Patient feeling much better, feels comfortable with DC home. He and his wife have contacted his urologist Dr. Mena GoesEskridge for follow up.   Will discharge home with Flomax, Zofran, and Norco with urology follow up. [place] Controlled Substance reporting System queried. I discussed results, treatment plan, need for urology follow-up, and return precautions with the patient & his wife @ bedside. Provided opportunity for questions, patient & his wife confirmed understanding and are in agreement with plan.   Vitals:   02/09/19 1330 02/09/19 1402  BP: 118/76 108/80  Pulse: (!) 54 63  Resp:  17  Temp:    SpO2: 99% 97%    Final Clinical Impressions(s) / ED Diagnoses   Final diagnoses:  Kidney stone    ED Discharge Orders         Ordered    ondansetron (ZOFRAN ODT) 4 MG disintegrating tablet  Every 8 hours PRN     02/09/19 1349    HYDROcodone-acetaminophen (NORCO/VICODIN) 5-325 MG tablet  Every 4 hours PRN     02/09/19 1349    tamsulosin (FLOMAX) 0.4 MG CAPS capsule  Daily after supper     02/09/19 [address]1349           Kristofer Schaffert, Pleas KochSamantha R, PA-C 02/09/19 1410    Azalia Bilisampos, Kevin, MD 02/10/19 [phone]

## 2019-02-09 NOTE — ED Triage Notes (Signed)
Patient dropped off by wife.    C/O left flank pain that started 1.5 hours ago.   10/10 sharp  Hx. Kidney stones.    A/ox4 Ambulatory in triage.

## 2019-02-09 NOTE — ED Notes (Signed)
Patient transported to CT 

## 2019-02-10 ENCOUNTER — Other Ambulatory Visit: Payer: Self-pay | Admitting: Urology

## 2019-02-10 DIAGNOSIS — R8271 Bacteriuria: Secondary | ICD-10-CM | POA: Diagnosis not present

## 2019-02-10 DIAGNOSIS — N202 Calculus of kidney with calculus of ureter: Secondary | ICD-10-CM | POA: Diagnosis not present

## 2019-02-10 LAB — URINE CULTURE: Culture: <10000 — AB

## 2019-02-11 ENCOUNTER — Other Ambulatory Visit: Payer: Self-pay

## 2019-02-11 ENCOUNTER — Encounter (HOSPITAL_COMMUNITY): Payer: Self-pay | Admitting: *Deleted

## 2019-02-15 ENCOUNTER — Encounter (HOSPITAL_COMMUNITY)
Admission: RE | Disposition: A | Discharge status: Home / Self Care | Payer: Self-pay | Source: Home / Self Care | Attending: Urology

## 2019-02-15 ENCOUNTER — Encounter (HOSPITAL_COMMUNITY): Payer: Self-pay | Admitting: *Deleted

## 2019-02-15 ENCOUNTER — Other Ambulatory Visit: Payer: Self-pay

## 2019-02-15 ENCOUNTER — Ambulatory Visit (HOSPITAL_COMMUNITY): Payer: BC Managed Care – PPO

## 2019-02-15 ENCOUNTER — Ambulatory Visit (HOSPITAL_COMMUNITY)
Admission: RE | Admit: 2019-02-15 | Discharge: 2019-02-15 | Disposition: A | Discharge status: Home / Self Care | Payer: BC Managed Care – PPO | Attending: Urology | Admitting: Urology

## 2019-02-15 DIAGNOSIS — N201 Calculus of ureter: Secondary | ICD-10-CM | POA: Insufficient documentation

## 2019-02-15 DIAGNOSIS — Z01818 Encounter for other preprocedural examination: Secondary | ICD-10-CM | POA: Diagnosis not present

## 2019-02-15 HISTORY — PX: EXTRACORPOREAL SHOCK WAVE LITHOTRIPSY: SHX1557

## 2019-02-15 HISTORY — DX: Personal history of urinary calculi: Z87.442

## 2019-02-15 SURGERY — LITHOTRIPSY, ESWL
Anesthesia: LOCAL | Laterality: Left

## 2019-02-15 MED ORDER — DIAZEPAM 5 MG PO TABS
10.0000 mg | ORAL_TABLET | ORAL | Status: AC
  Administered 2019-02-15: 10 mg via ORAL
  Filled 2019-02-15: qty 2

## 2019-02-15 MED ORDER — DIPHENHYDRAMINE HCL 25 MG PO CAPS
25.0000 mg | ORAL_CAPSULE | ORAL | Status: AC
  Administered 2019-02-15: 09:00:00 25 mg via ORAL
  Filled 2019-02-15: qty 1

## 2019-02-15 MED ORDER — CIPROFLOXACIN HCL 500 MG PO TABS
500.0000 mg | ORAL_TABLET | ORAL | Status: AC
  Administered 2019-02-15: 500 mg via ORAL
  Filled 2019-02-15: qty 1

## 2019-02-15 MED ORDER — SODIUM CHLORIDE 0.9 % IV SOLN
INTRAVENOUS | Status: DC
  Administered 2019-02-15: 09:00:00 via INTRAVENOUS

## 2019-02-15 MED ORDER — HYDROCODONE-ACETAMINOPHEN 5-325 MG PO TABS: 1.0000 | ORAL_TABLET | Freq: Four times a day (QID) | ORAL | 0 refills | Status: DC | PRN

## 2019-02-15 NOTE — H&P (Signed)
Devon Carter is an 48 y.o. male.    Chief Complaint: Pre-Op LEFT Shockwave Lithotripsy  HPI:   1 - LEFT Distal Ureteral Stone - 66mm left distal stone by Er CT on eval flank pain. Stone is at level of lower SI joint, 1000HU, SSD 8cm from anterior. UCX non-clonal.  Today "[name]" is seen to proceed with LEFT shockwave lithotrispy. NO interval fevers.   Past Medical History:  Diagnosis Date  . Chronic kidney disease    kidney stones  . GERD (gastroesophageal reflux disease)   . Seizures ([place])    grand mal, age 84  No medication since age 7 and no recurrent seizure.  . Vitamin B 12 deficiency   . Vitamin D deficiency disease     Past Surgical History:  Procedure Laterality Date  . APPENDECTOMY  1994  . HERNIA REPAIR  2002, 2010    Family History  Problem Relation Age of Onset  . Arthritis Mother   . Cancer Father        prostate, esophygeal  . Heart disease Father        CHF ? etiology   Social History:  reports that he has never smoked. He has never used smokeless tobacco. He reports that he does not use drugs. No history on file for alcohol.  Allergies:  Allergies  Allergen Reactions  . Percocet [Oxycodone-Acetaminophen] Nausea Only  . Celebrex [Celecoxib] Rash    No medications prior to admission.    No results found for this or any previous visit (from the past 48 hour(s)). No results found.  Review of Systems  Constitutional: Negative for chills and fever.  Genitourinary: Positive for flank pain.  All other systems reviewed and are negative.   Height 6' (1.829 m), weight 81.6 kg. Physical Exam  Constitutional: He appears well-developed.  HENT:  Head: Normocephalic.  Eyes: Pupils are equal, round, and reactive to light.  Neck: Normal range of motion.  Cardiovascular: Normal rate.  Respiratory: Effort normal.  GI: Soft.  Genitourinary:    Genitourinary Comments: Mild left CVAT at present.    Neurological: He is alert.  Skin: Skin is  warm.     Assessment/Plan Proceed as planned with LEFT shockwave lithotripsy. Risks, benefits, alternatives, expected peri-treatment course discussed.   Alexis Frock, MD 02/15/2019, 5:26 AM

## 2019-02-15 NOTE — Discharge Instructions (Signed)
1 - You may have urinary urgency (bladder spasms), pass small stone fragments, and bloody urine on / off for up to 2 weeks. This is normal. ° °2 - Call MD or go to ER for fever >102, severe pain / nausea / vomiting not relieved by medications, or acute change in medical status ° °

## 2019-02-15 NOTE — Brief Op Note (Signed)
02/15/2019  11:33 AM  PATIENT:  Celesta Aver Schoenberg  48 y.o. male  PRE-OPERATIVE DIAGNOSIS:  LEFT URETERAL STONE  POST-OPERATIVE DIAGNOSIS:  * No post-op diagnosis entered *  PROCEDURE:  Procedure(s): EXTRACORPOREAL SHOCK WAVE LITHOTRIPSY (ESWL) (Left)  SURGEON:  Surgeon(s) and Role:    * Alexis Frock, MD - Primary  PHYSICIAN ASSISTANT:   ASSISTANTS: none   ANESTHESIA:   MAC  EBL:  minimal   BLOOD ADMINISTERED:none  DRAINS: none   LOCAL MEDICATIONS USED:  NONE  SPECIMEN:  No Specimen  DISPOSITION OF SPECIMEN:  N/A  COUNTS:  YES  TOURNIQUET:  * No tourniquets in log *  DICTATION: .Note written in paper chart  PLAN OF CARE: Discharge to home after PACU  PATIENT DISPOSITION:  Short Stay   Delay start of Pharmacological VTE agent (>24hrs) due to surgical blood loss or risk of bleeding: not applicable

## 2019-02-16 ENCOUNTER — Encounter (HOSPITAL_COMMUNITY): Payer: Self-pay | Admitting: Urology

## 2019-03-05 ENCOUNTER — Encounter: Payer: Self-pay | Admitting: Family Medicine

## 2019-03-05 ENCOUNTER — Other Ambulatory Visit: Payer: Self-pay

## 2019-03-05 ENCOUNTER — Ambulatory Visit (INDEPENDENT_AMBULATORY_CARE_PROVIDER_SITE_OTHER): Payer: BC Managed Care – PPO | Admitting: Family Medicine

## 2019-03-05 VITALS — BP 116/74 | HR 71 | Temp 97.5°F | Ht 70.1 in | Wt 180.0 lb

## 2019-03-05 DIAGNOSIS — N200 Calculus of kidney: Secondary | ICD-10-CM

## 2019-03-05 NOTE — Progress Notes (Signed)
  Subjective:     Patient ID: Devon Carter, male   DOB: 12-Apr-1971, 48 y.o.   MRN: [id number]  HPI Patient seen to discuss recent kidney stone issues.  He had severe prolonged pain for several days.  He consulted urology and after multiple days of vomiting and severe pain eventually underwent lithotripsy for 5 mm distal left ureteral stone.  He had some mild hydronephrosis.  Incidental note of 2 small nonobstructing right renal stones.  He is pain-free at this time and has no urinary symptoms.  He states he has probably had 15 stones in total in his lifetime.  His recollection is that these are calcium oxalate stones from previous assessment.  He states a friend of his who is a nephrologist had sent off for stone analysis and 24-hour urine studies.  He had recent CBC and basic metabolic panel which were unremarkable  He is requesting establishing with another urology practice if possible  Past Medical History:  Diagnosis Date  . Chronic kidney disease    kidney stones  . GERD (gastroesophageal reflux disease)   . History of kidney stones   . Seizures ([place])    grand mal, age 68  No medication since age 82 and no recurrent seizure.  . Vitamin B 12 deficiency   . Vitamin D deficiency disease    Past Surgical History:  Procedure Laterality Date  . APPENDECTOMY  1994  . EXTRACORPOREAL SHOCK WAVE LITHOTRIPSY Left 02/15/2019   Procedure: EXTRACORPOREAL SHOCK WAVE LITHOTRIPSY (ESWL);  Surgeon: Alexis Frock, MD;  Location: [facility];  Service: Urology;  Laterality: Left;  . HERNIA REPAIR  2002, 2010  . left shoulder surgery      reports that he has never smoked. He has never used smokeless tobacco. He reports that he does not use drugs. No history on file for alcohol. family history includes Arthritis in his mother; Cancer in his father; Heart disease in his father. Allergies  Allergen Reactions  . Percocet [Oxycodone-Acetaminophen] Nausea Only  . Celebrex [Celecoxib] Rash      Review of Systems  Constitutional: Negative for chills and fever.  Genitourinary: Negative for difficulty urinating, dysuria, flank pain, hematuria, testicular pain and urgency.       Objective:   Physical Exam Constitutional:      Appearance: Normal appearance.  Cardiovascular:     Rate and Rhythm: Normal rate and regular rhythm.     Heart sounds: No murmur.  Pulmonary:     Effort: Pulmonary effort is normal.     Breath sounds: Normal breath sounds.  Neurological:     Mental Status: He is alert.        Assessment:     History of recurrent kidney stones with reported calcium oxalate stones.  Recent 5 mm distal left ureteral stone which required lithotripsy    Plan:     -Per patient request will set up referral to another urology group -He knows importance of staying well-hydrated. -We recommend try to get studies for stone analysis and 24-hour urine study sent here once completed  Eulas Post MD [facility]

## 2019-04-29 DIAGNOSIS — N200 Calculus of kidney: Secondary | ICD-10-CM | POA: Diagnosis not present

## 2019-10-29 ENCOUNTER — Other Ambulatory Visit: Payer: Self-pay | Admitting: Cardiovascular Disease

## 2019-10-29 MED ORDER — SCOPOLAMINE 1 MG/3DAYS TD PT72: 1.0000 | MEDICATED_PATCH | TRANSDERMAL | 12 refills | Status: DC

## 2019-11-30 DIAGNOSIS — K449 Diaphragmatic hernia without obstruction or gangrene: Secondary | ICD-10-CM | POA: Diagnosis not present

## 2019-11-30 DIAGNOSIS — K219 Gastro-esophageal reflux disease without esophagitis: Secondary | ICD-10-CM | POA: Diagnosis not present

## 2019-11-30 DIAGNOSIS — Z1211 Encounter for screening for malignant neoplasm of colon: Secondary | ICD-10-CM | POA: Diagnosis not present

## 2019-11-30 DIAGNOSIS — Z800 Family history of malignant neoplasm of digestive organs: Secondary | ICD-10-CM | POA: Diagnosis not present

## 2019-12-27 DIAGNOSIS — E559 Vitamin D deficiency, unspecified: Secondary | ICD-10-CM | POA: Diagnosis not present

## 2019-12-27 DIAGNOSIS — R109 Unspecified abdominal pain: Secondary | ICD-10-CM | POA: Diagnosis not present

## 2019-12-27 DIAGNOSIS — R5383 Other fatigue: Secondary | ICD-10-CM | POA: Diagnosis not present

## 2019-12-27 DIAGNOSIS — R7989 Other specified abnormal findings of blood chemistry: Secondary | ICD-10-CM | POA: Diagnosis not present

## 2020-01-06 DIAGNOSIS — B379 Candidiasis, unspecified: Secondary | ICD-10-CM | POA: Diagnosis not present

## 2020-01-06 DIAGNOSIS — N200 Calculus of kidney: Secondary | ICD-10-CM | POA: Diagnosis not present

## 2020-01-06 DIAGNOSIS — E559 Vitamin D deficiency, unspecified: Secondary | ICD-10-CM | POA: Diagnosis not present

## 2020-01-06 DIAGNOSIS — R7989 Other specified abnormal findings of blood chemistry: Secondary | ICD-10-CM | POA: Diagnosis not present

## 2020-01-12 DIAGNOSIS — K2100 Gastro-esophageal reflux disease with esophagitis, without bleeding: Secondary | ICD-10-CM | POA: Diagnosis not present

## 2020-01-12 DIAGNOSIS — Z800 Family history of malignant neoplasm of digestive organs: Secondary | ICD-10-CM | POA: Diagnosis not present

## 2020-01-12 DIAGNOSIS — Z1211 Encounter for screening for malignant neoplasm of colon: Secondary | ICD-10-CM | POA: Diagnosis not present

## 2020-04-07 ENCOUNTER — Other Ambulatory Visit: Payer: BC Managed Care – PPO

## 2020-04-07 DIAGNOSIS — Z20822 Contact with and (suspected) exposure to covid-19: Secondary | ICD-10-CM

## 2020-04-09 LAB — SARS-COV-2, NAA 2 DAY TAT

## 2020-04-09 LAB — NOVEL CORONAVIRUS, NAA: SARS-CoV-2, NAA: DETECTED — AB

## 2020-04-26 IMAGING — DX DG LUMBAR SPINE COMPLETE 4+V
5 series · 5 of 5 positions shown · non-contrast
Comparison: MRI 07/12/2016.  CT 11/22/2009.

CLINICAL DATA: Right-sided lower back pain.  No known injury.

EXAM:
LUMBAR SPINE - COMPLETE 4+ VIEW

[l-spine ap]
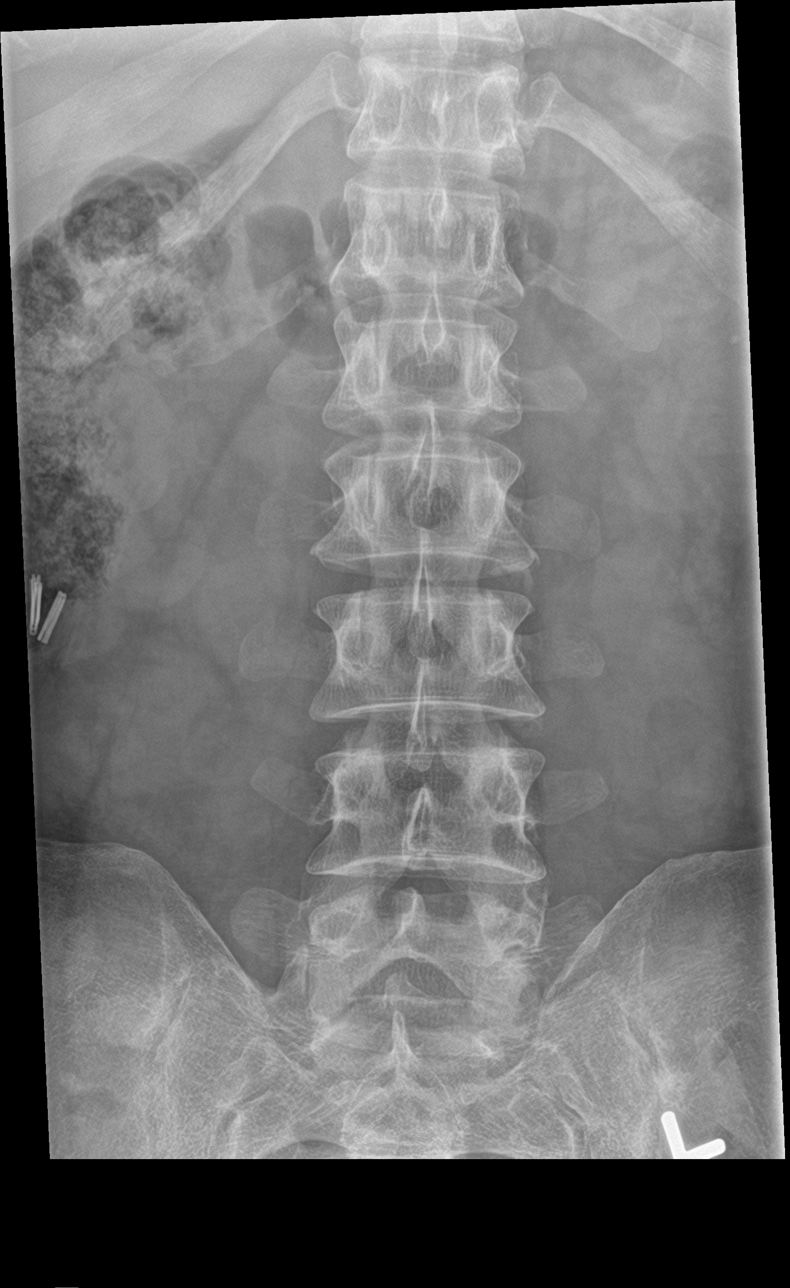

[l-spine obl (1 of 2)]
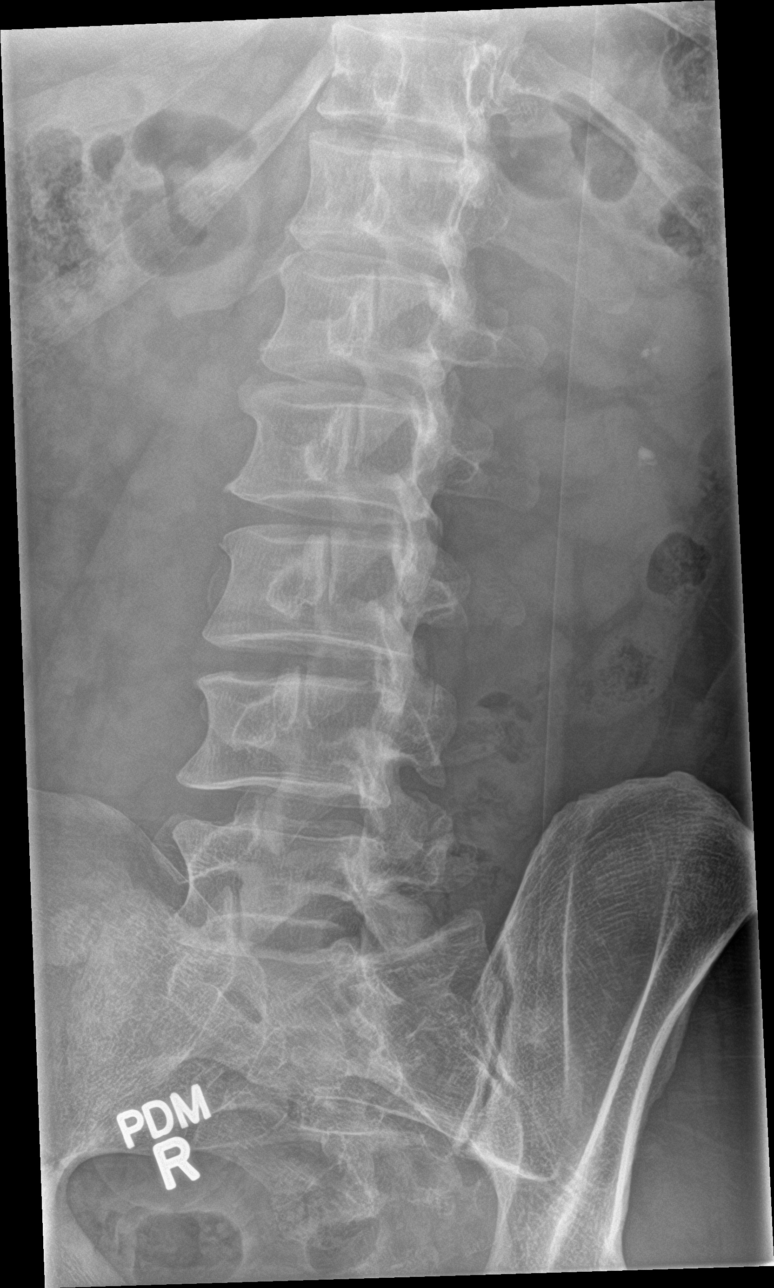

[l-spine obl (2 of 2)]
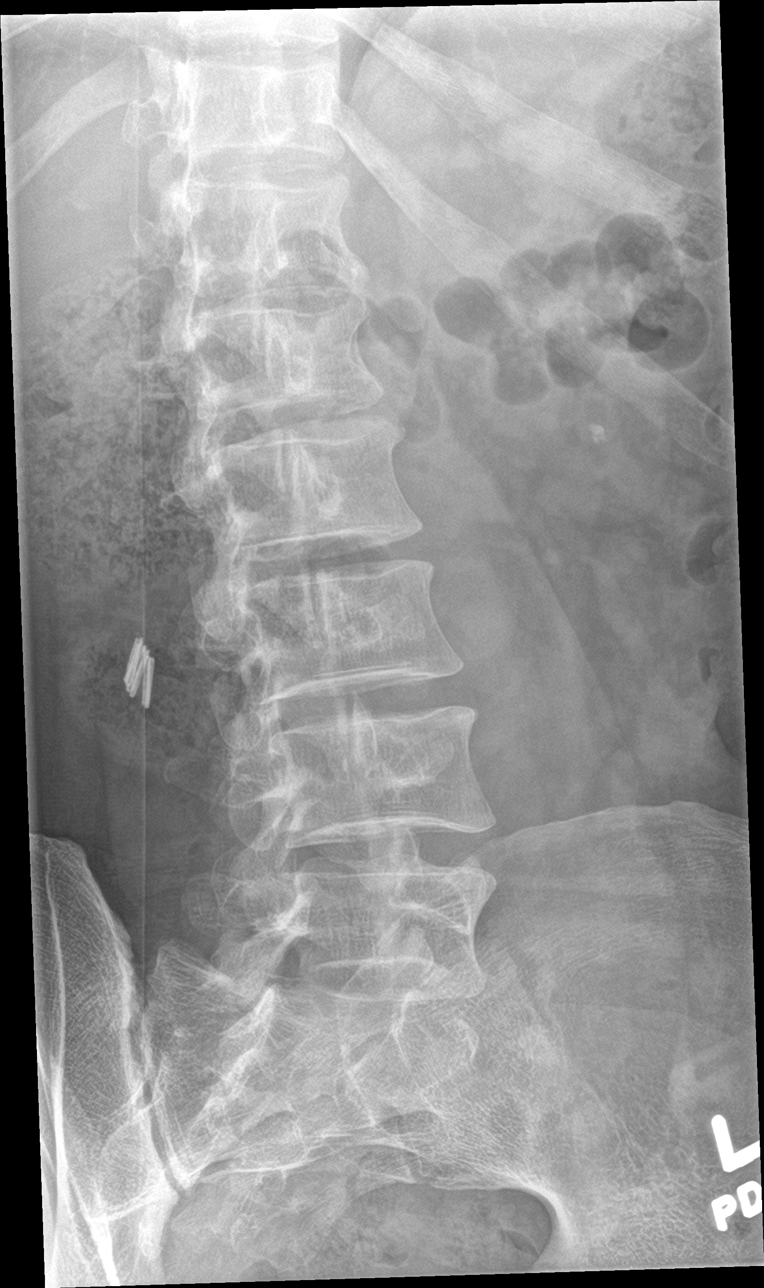

[l-spine lat]
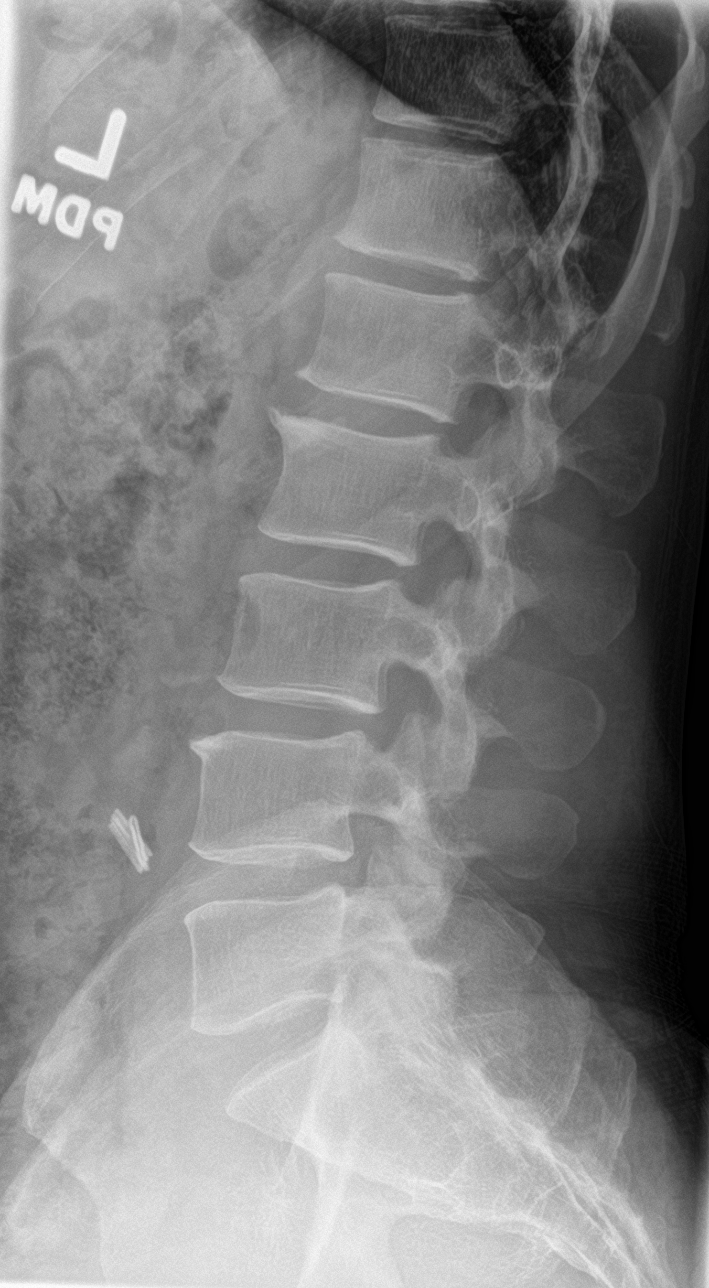

[l-spine spot]
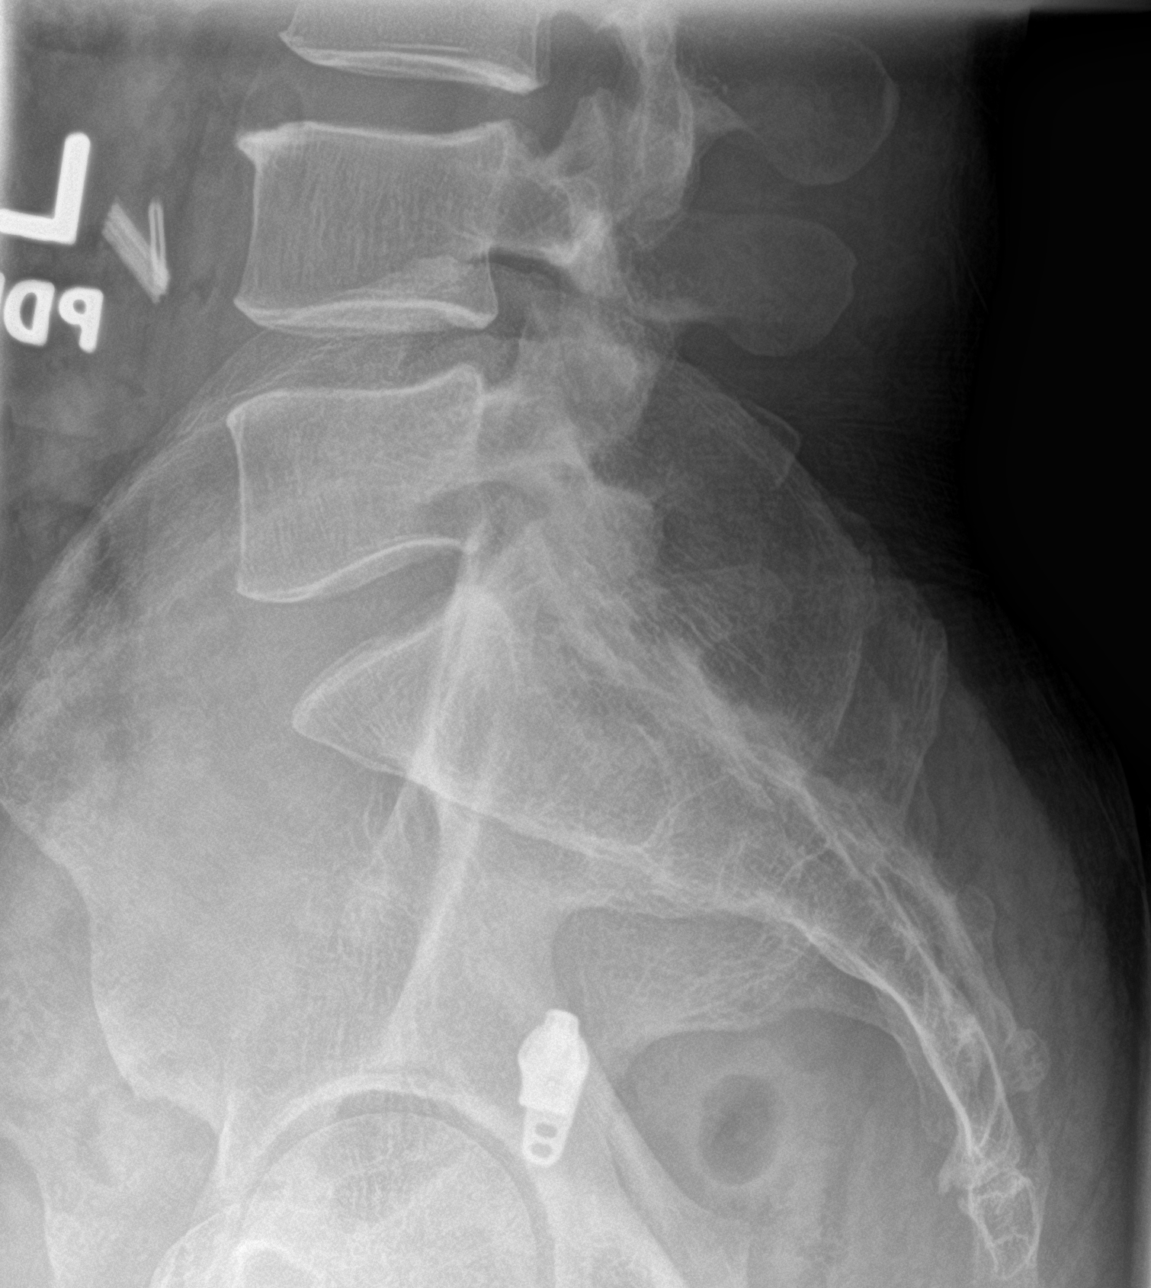

[5 of 5 positions shown; findings below may reference images not displayed]

FINDINGS: Paraspinal soft tissues are normal. No acute bony abnormality. No
evidence of fracture. Calcifications noted over the left kidney
consistent with left nephrolithiasis.
IMPRESSION: 1.  No acute bony abnormality identified.

2. Calcific densities in the left kidney suggesting left
nephrolithiasis.

## 2020-07-13 ENCOUNTER — Telehealth: Payer: BC Managed Care – PPO | Admitting: Orthopedic Surgery

## 2020-07-13 DIAGNOSIS — R059 Cough, unspecified: Secondary | ICD-10-CM

## 2020-07-13 MED ORDER — PREDNISONE 20 MG PO TABS
40.0000 mg | ORAL_TABLET | Freq: Every day | ORAL | 0 refills | Status: AC
Start: 2020-07-13 — End: 2020-07-18

## 2020-07-13 MED ORDER — BENZONATATE 100 MG PO CAPS
100.0000 mg | ORAL_CAPSULE | Freq: Two times a day (BID) | ORAL | 0 refills | Status: DC | PRN
Start: 2020-07-13 — End: 2022-11-20

## 2020-07-13 NOTE — Progress Notes (Signed)
We are sorry that you are not feeling well.  Here is how we plan to help!  Based on your presentation I believe you most likely have A cough due to a virus.  This is called viral bronchitis and is best treated by rest, plenty of fluids and control of the cough.  You may use Ibuprofen or Tylenol as directed to help your symptoms.    IT WOULD BE EXTREMLY UNUSUAL FOR YOU TO GET A COVID INFECTION AGAIN SO SOON. THIS IS LIKELY ANOTHER VIRAL ILLNESS.   In addition you may use A prescription cough medication called Tessalon Perles 100mg . You may take 1-2 capsules every 8 hours as needed for your cough.  Prednisone 40mg  daily for 5 days.  From your responses in the eVisit questionnaire you describe inflammation in the upper respiratory tract which is causing a significant cough.  This is commonly called Bronchitis and has four common causes:    Allergies  Viral Infections  Acid Reflux  Bacterial Infection Allergies, viruses and acid reflux are treated by controlling symptoms or eliminating the cause. An example might be a cough caused by taking certain blood pressure medications. You stop the cough by changing the medication. Another example might be a cough caused by acid reflux. Controlling the reflux helps control the cough.  USE OF BRONCHODILATOR ("RESCUE") INHALERS: There is a risk from using your bronchodilator too frequently.  The risk is that over-reliance on a medication which only relaxes the muscles surrounding the breathing tubes can reduce the effectiveness of medications prescribed to reduce swelling and congestion of the tubes themselves.  Although you feel brief relief from the bronchodilator inhaler, your asthma may actually be worsening with the tubes becoming more swollen and filled with mucus.  This can delay other crucial treatments, such as oral steroid medications. If you need to use a bronchodilator inhaler daily, several times per day, you should discuss this with your  provider.  There are probably better treatments that could be used to keep your asthma under control.     HOME CARE . Only take medications as instructed by your medical team. . Complete the entire course of an antibiotic. . Drink plenty of fluids and get plenty of rest. . Avoid close contacts especially the very young and the elderly . Cover your mouth if you cough or cough into your sleeve. . Always remember to wash your hands . A steam or ultrasonic humidifier can help congestion.   GET HELP RIGHT AWAY IF: . You develop worsening fever. . You become short of breath . You cough up blood. . Your symptoms persist after you have completed your treatment plan MAKE SURE YOU   Understand these instructions.  Will watch your condition.  Will get help right away if you are not doing well or get worse.  Your e-visit answers were reviewed by a board certified advanced clinical practitioner to complete your personal care plan.  Depending on the condition, your plan could have included both over the counter or prescription medications. If there is a problem please reply  once you have received a response from your provider. Your safety is important to .  If you have drug allergies check your prescription carefully.    You can use MyChart to ask questions about today's visit, request a non-urgent call back, or ask for a work or school excuse for 24 hours related to this e-Visit. If it has been greater than 24 hours you will need to follow up  with your provider, or enter a new e-Visit to address those concerns. You will get an e-mail in the next two days asking about your experience.  I hope that your e-visit has been valuable and will speed your recovery. Thank you for using e-visits.   Greater than 5 minutes, yet less than 10 minutes of time have been spent researching, coordinating and implementing care for this patient today.

## 2020-07-14 ENCOUNTER — Other Ambulatory Visit: Payer: Self-pay | Admitting: Cardiovascular Disease

## 2020-07-14 MED ORDER — OSELTAMIVIR PHOSPHATE 75 MG PO CAPS
75.0000 mg | ORAL_CAPSULE | Freq: Two times a day (BID) | ORAL | 0 refills | Status: AC
Start: 2020-07-14 — End: 2020-07-29

## 2020-08-20 NOTE — Progress Notes (Deleted)
[name] [address] [place] [address] Phone: [phone] Subjective:    I'm seeing this patient by the request  of:  Kristian Covey, MD  CC:   [id number]  Devon Carter is a 50 y.o. male coming in with complaint of low back pain. Was seen in 2020 for OMT. Patient here for X knee pain today. Patient states   MRI lumbar 09/10/2018  IMPRESSION: 1. No evident right-sided impingement. 2. Small left extraforaminal disc protrusions at L2-L3 and L3-L4 encroaching on and potentially irritating the exiting left L2 and L3 nerve roots, respectively. 3. Left foraminal annular fissure at L4-L5 potentially irritates the exiting left L4 nerve root.    Past Medical History:  Diagnosis Date  . Chronic kidney disease    kidney stones  . GERD (gastroesophageal reflux disease)   . History of kidney stones   . Seizures (HCC)    grand mal, age 73  No medication since age 85 and no recurrent seizure.  . Vitamin B 12 deficiency   . Vitamin D deficiency disease    Past Surgical History:  Procedure Laterality Date  . APPENDECTOMY  1994  . EXTRACORPOREAL SHOCK WAVE LITHOTRIPSY Left 02/15/2019   Procedure: EXTRACORPOREAL SHOCK WAVE LITHOTRIPSY (ESWL);  Surgeon: Sebastian Ache, MD;  Location: [facility];  Service: Urology;  Laterality: Left;  . HERNIA REPAIR  2002, 2010  . left shoulder surgery     Social History   Socioeconomic History  . Marital status: Married    Spouse name: Not on file  . Number of children: Not on file  . Years of education: Not on file  . Highest education level: Not on file  Occupational History  . Not on file  Tobacco Use  . Smoking status: Never Smoker  . Smokeless tobacco: Never Used  Vaping Use  . Vaping Use: Never used  Substance and Sexual Activity  . Alcohol use: Not on file  . Drug use: Never  . Sexual activity: Not on file  Other Topics Concern  . Not on file  Social History Narrative  . Not  on file   Social Determinants of Health   Financial Resource Strain: Not on file  Food Insecurity: Not on file  Transportation Needs: Not on file  Physical Activity: Not on file  Stress: Not on file  Social Connections: Not on file   Allergies  Allergen Reactions  . Percocet [Oxycodone-Acetaminophen] Nausea Only  . Celebrex [Celecoxib] Rash   Family History  Problem Relation Age of Onset  . Arthritis Mother   . Cancer Father        prostate, esophygeal  . Heart disease Father        CHF ? etiology    Current Outpatient Medications (Endocrine & Metabolic):  .  testosterone cypionate (DEPOTESTOSTERONE CYPIONATE) 200 MG/ML injection, Inject 100 mg into the muscle every 7 (seven) days.    Current Outpatient Medications (Respiratory):  .  benzonatate (TESSALON) 100 MG capsule, Take 1 capsule (100 mg total) by mouth 2 (two) times daily as needed for cough.  Current Outpatient Medications (Analgesics):  .  HYDROcodone-acetaminophen (NORCO/VICODIN) 5-325 MG tablet, Take 1-2 tablets by mouth every 6 (six) hours as needed for moderate pain or severe pain. After lithotripsy (Patient not taking: Reported on 03/05/2019) .  ketorolac (TORADOL) 30 MG/ML injection, Inject 30 mg into the vein once.  Current Outpatient Medications (Hematological):  .  vitamin B-12 (CYANOCOBALAMIN) 1000 MCG tablet, Take 1,000 mcg  by mouth daily.   Current Outpatient Medications (Other):  Marland Kitchen  Cholecalciferol (VITAMIN D3) 1000 UNITS CAPS, Take 1,000 capsules by mouth daily. Reported on 09/08/2015 .  Lactobacillus (ACIDOPHILUS PROBIOTIC) 10 MG TABS, Take 2 tablets by mouth daily. .  ondansetron (ZOFRAN ODT) 4 MG disintegrating tablet, Take 1 tablet (4 mg total) by mouth every 8 (eight) hours as needed for nausea or vomiting. (Patient not taking: Reported on 03/05/2019) .  scopolamine (TRANSDERM-SCOP, 1.5 MG,) 1 MG/3DAYS, Place 1 patch (1.5 mg total) onto the skin every 3 (three) days. .  tamsulosin (FLOMAX) 0.4 MG  CAPS capsule, Take 1 capsule (0.4 mg total) by mouth daily after supper.   Reviewed prior external information including notes and imaging from  primary care provider As well as notes that were available from care everywhere and other healthcare systems.  Past medical history, social, surgical and family history all reviewed in electronic medical record.  No pertanent information unless stated regarding to the chief complaint.   Review of Systems:  No headache, visual changes, nausea, vomiting, diarrhea, constipation, dizziness, abdominal pain, skin rash, fevers, chills, night sweats, weight loss, swollen lymph nodes, body aches, joint swelling, chest pain, shortness of breath, mood changes. POSITIVE muscle aches  Objective  There were no vitals taken for this visit.   General: No apparent distress alert and oriented x3 mood and affect normal, dressed appropriately.  HEENT: Pupils equal, extraocular movements intact  Respiratory: Patient's speak in full sentences and does not appear short of breath  Cardiovascular: No lower extremity edema, non tender, no erythema  Gait normal with good balance and coordination.  MSK:  Non tender with full range of motion and good stability and symmetric strength and tone of shoulders, elbows, wrist, hip, knee and ankles bilaterally.     Impression and Recommendations:     The above documentation has been reviewed and is accurate and complete Judi Saa, DO

## 2020-08-21 ENCOUNTER — Ambulatory Visit: Payer: BC Managed Care – PPO | Admitting: Family Medicine

## 2020-08-25 NOTE — Progress Notes (Signed)
[name] [address] [place] [address] Phone: [phone] Subjective:   I [name] am serving as a Neurosurgeon for Dr. Antoine Primas.  This visit occurred during the SARS-CoV-2 public health emergency.  Safety protocols were in place, including screening questions prior to the visit, additional usage of staff PPE, and extensive cleaning of exam room while observing appropriate contact time as indicated for disinfecting solutions.   I'm seeing this patient by the request  of:  Kristian Covey, MD  CC: Right knee pain  [id number]  Devon Carter is a 50 y.o. male coming in with complaint of right knee pain. Last seen 2 years ago for OMT. Patient states injured it before christmas. Gave it some time and played pickle ball last wednesday. Walking with a limp. Lateral movement is painful. Pain on the medial side. Remembers driving to the basket and felt an acute pain. Stairs is difficult. Taking ibuprofen. Past injury at 46 and fractured his patella. States it was a "corkscrew" injury. Some swelling in the posterior knee. Posterior swelling is chronic. Has also tried ice and heat. Wearing a sleeve with activity. Knee brace made it worse.      Past Medical History:  Diagnosis Date  . Chronic kidney disease    kidney stones  . GERD (gastroesophageal reflux disease)   . History of kidney stones   . Seizures (HCC)    grand mal, age 45  No medication since age 82 and no recurrent seizure.  . Vitamin B 12 deficiency   . Vitamin D deficiency disease    Past Surgical History:  Procedure Laterality Date  . APPENDECTOMY  1994  . EXTRACORPOREAL SHOCK WAVE LITHOTRIPSY Left 02/15/2019   Procedure: EXTRACORPOREAL SHOCK WAVE LITHOTRIPSY (ESWL);  Surgeon: Sebastian Ache, MD;  Location: [facility];  Service: Urology;  Laterality: Left;  . HERNIA REPAIR  2002, 2010  . left shoulder surgery     Social History   Socioeconomic History  . Marital  status: Married    Spouse name: Not on file  . Number of children: Not on file  . Years of education: Not on file  . Highest education level: Not on file  Occupational History  . Not on file  Tobacco Use  . Smoking status: Never Smoker  . Smokeless tobacco: Never Used  Vaping Use  . Vaping Use: Never used  Substance and Sexual Activity  . Alcohol use: Not on file  . Drug use: Never  . Sexual activity: Not on file  Other Topics Concern  . Not on file  Social History Narrative  . Not on file   Social Determinants of Health   Financial Resource Strain: Not on file  Food Insecurity: Not on file  Transportation Needs: Not on file  Physical Activity: Not on file  Stress: Not on file  Social Connections: Not on file   Allergies  Allergen Reactions  . Percocet [Oxycodone-Acetaminophen] Nausea Only  . Celebrex [Celecoxib] Rash   Family History  Problem Relation Age of Onset  . Arthritis Mother   . Cancer Father        prostate, esophygeal  . Heart disease Father        CHF ? etiology    Current Outpatient Medications (Endocrine & Metabolic):  .  testosterone cypionate (DEPOTESTOSTERONE CYPIONATE) 200 MG/ML injection, Inject 100 mg into the muscle every 7 (seven) days.    Current Outpatient Medications (Respiratory):  .  benzonatate (TESSALON) 100 MG  capsule, Take 1 capsule (100 mg total) by mouth 2 (two) times daily as needed for cough.  Current Outpatient Medications (Analgesics):  .  HYDROcodone-acetaminophen (NORCO/VICODIN) 5-325 MG tablet, Take 1-2 tablets by mouth every 6 (six) hours as needed for moderate pain or severe pain. After lithotripsy .  ketorolac (TORADOL) 30 MG/ML injection, Inject 30 mg into the vein once.  Current Outpatient Medications (Hematological):  .  vitamin B-12 (CYANOCOBALAMIN) 1000 MCG tablet, Take 1,000 mcg by mouth daily.   Current Outpatient Medications (Other):  Marland Kitchen  Cholecalciferol (VITAMIN D3) 1000 UNITS CAPS, Take 1,000 capsules by  mouth daily. Reported on 09/08/2015 .  Lactobacillus (ACIDOPHILUS PROBIOTIC) 10 MG TABS, Take 2 tablets by mouth daily. .  ondansetron (ZOFRAN ODT) 4 MG disintegrating tablet, Take 1 tablet (4 mg total) by mouth every 8 (eight) hours as needed for nausea or vomiting. Marland Kitchen  scopolamine (TRANSDERM-SCOP, 1.5 MG,) 1 MG/3DAYS, Place 1 patch (1.5 mg total) onto the skin every 3 (three) days. .  tamsulosin (FLOMAX) 0.4 MG CAPS capsule, Take 1 capsule (0.4 mg total) by mouth daily after supper.   Reviewed prior external information including notes and imaging from  primary care provider As well as notes that were available from care everywhere and other healthcare systems.  Past medical history, social, surgical and family history all reviewed in electronic medical record.  No pertanent information unless stated regarding to the chief complaint.   Review of Systems:  No headache, visual changes, nausea, vomiting, diarrhea, constipation, dizziness, abdominal pain, skin rash, fevers, chills, night sweats, weight loss, swollen lymph nodes, body aches, joint swelling, chest pain, shortness of breath, mood changes. POSITIVE muscle aches  Objective  Blood pressure 140/90, pulse 63, height 5\' 10"  (1.778 m), weight 191 lb (86.6 kg), SpO2 98 %.   General: No apparent distress alert and oriented x3 mood and affect normal, dressed appropriately.  HEENT: Pupils equal, extraocular movements intact  Respiratory: Patient's speak in full sentences and does not appear short of breath  Cardiovascular: No lower extremity edema, non tender, no erythema  Gait normal with good balance and coordination.  MSK:  Right knee exam shows the patient does have full range of motion.  Patient does though have a positive Thessaly's test.  Patient has a negative McMurray's though.  Tender to palpation over the medial joint line.  No significant instability of the knee noted today.  ACL appears to be intact.  Neurovascularly intact  distally.  Limited musculoskeletal ultrasound was performed and interpreted by  Limited ultrasound of patient's right knee shows that patient has very mild narrowing of the patellofemoral joint noted.  No significant effusion noted.  Patient's medial meniscus does have some calcific versus scar tissue formation with what appears to be an acute on chronic very small displaced posterior medial meniscus tear. Impression: Medial meniscus tear less than 10% displacement posteriorly questionably acute on chronic.    Impression and Recommendations:     The above documentation has been reviewed and is accurate and complete Judi Saa, DO

## 2020-08-29 ENCOUNTER — Ambulatory Visit (INDEPENDENT_AMBULATORY_CARE_PROVIDER_SITE_OTHER): Payer: BC Managed Care – PPO

## 2020-08-29 ENCOUNTER — Ambulatory Visit: Payer: Self-pay

## 2020-08-29 ENCOUNTER — Ambulatory Visit: Payer: BC Managed Care – PPO | Admitting: Family Medicine

## 2020-08-29 ENCOUNTER — Other Ambulatory Visit: Payer: Self-pay

## 2020-08-29 ENCOUNTER — Encounter: Payer: Self-pay | Admitting: Family Medicine

## 2020-08-29 VITALS — BP 140/90 | HR 63 | Ht 70.0 in | Wt 191.0 lb

## 2020-08-29 DIAGNOSIS — M25561 Pain in right knee: Secondary | ICD-10-CM | POA: Diagnosis not present

## 2020-08-29 DIAGNOSIS — S83241A Other tear of medial meniscus, current injury, right knee, initial encounter: Secondary | ICD-10-CM

## 2020-08-29 DIAGNOSIS — G8929 Other chronic pain: Secondary | ICD-10-CM

## 2020-08-29 NOTE — Patient Instructions (Signed)
Good to see you Ice 20 mins 2 times a day Exercise 3 times a week Voltaren gel Avoid twisting See me again in 4-6 weeks

## 2020-08-29 NOTE — Assessment & Plan Note (Signed)
Appears to be acute on chronic.  Discussed with patient to avoid twisting motions, home exercises and work with Event organiser.  Discussed topical anti-inflammatories.  No need for bracing but can continue with compression.  And icing after activity.  Follow-up with me again in 4 to 6 weeks.  Worsening pain will consider the possibility of formal physical therapy or advanced imaging but I am optimistic patient will do well with conservative therapy

## 2020-09-08 ENCOUNTER — Other Ambulatory Visit: Payer: Self-pay | Admitting: Cardiovascular Disease

## 2020-09-08 MED ORDER — KETOROLAC TROMETHAMINE 10 MG PO TABS: 10.0000 mg | ORAL_TABLET | Freq: Four times a day (QID) | ORAL | 3 refills | Status: DC | PRN

## 2020-09-08 NOTE — Progress Notes (Signed)
Devon Carter woke up with flank pain  He has a hx of kidney stones and this pain is consistent with his kidney stones.  Will send in script for Toradol 10 mg Q 6 PRN   # 20 to [facility] [employer] [facility] [place]  3 refills      Kristeen Miss, MD  09/08/2020 9:48 AM    [facility] [address],  [address] [place], [place]  [address] Phone: [phone]; Fax: [phone]

## 2020-09-26 NOTE — Progress Notes (Signed)
[name] [address] [place] [address] Phone: [phone] Subjective:   I Devon Carter am serving as a Neurosurgeon for Dr. Antoine Primas.  This visit occurred during the SARS-CoV-2 public health emergency.  Safety protocols were in place, including screening questions prior to the visit, additional usage of staff PPE, and extensive cleaning of exam room while observing appropriate contact time as indicated for disinfecting solutions.   I'm seeing this patient by the request  of:  Kristian Covey, MD  CC: right knee pain follow up   [id number]  Devon Carter is a 50 y.o. male coming in with complaint of right knee pain. Was seen and found to have acute on chronic meniscal tear. Patient was to do home exercises. States the knee feels about how it felt 4 weeks ago. States he expected it to be better due to decreased activity. Running straight lines is difficult. Pain in the patellar tendon and posterior knee.      Past Medical History:  Diagnosis Date  . Chronic kidney disease    kidney stones  . GERD (gastroesophageal reflux disease)   . History of kidney stones   . Seizures (HCC)    grand mal, age 68  No medication since age 18 and no recurrent seizure.  . Vitamin B 12 deficiency   . Vitamin D deficiency disease    Past Surgical History:  Procedure Laterality Date  . APPENDECTOMY  1994  . EXTRACORPOREAL SHOCK WAVE LITHOTRIPSY Left 02/15/2019   Procedure: EXTRACORPOREAL SHOCK WAVE LITHOTRIPSY (ESWL);  Surgeon: Sebastian Ache, MD;  Location: [facility];  Service: Urology;  Laterality: Left;  . HERNIA REPAIR  2002, 2010  . left shoulder surgery     Social History   Socioeconomic History  . Marital status: Married    Spouse name: Not on file  . Number of children: Not on file  . Years of education: Not on file  . Highest education level: Not on file  Occupational History  . Not on file  Tobacco Use  . Smoking status:  Never Smoker  . Smokeless tobacco: Never Used  Vaping Use  . Vaping Use: Never used  Substance and Sexual Activity  . Alcohol use: Not on file  . Drug use: Never  . Sexual activity: Not on file  Other Topics Concern  . Not on file  Social History Narrative  . Not on file   Social Determinants of Health   Financial Resource Strain: Not on file  Food Insecurity: Not on file  Transportation Needs: Not on file  Physical Activity: Not on file  Stress: Not on file  Social Connections: Not on file   Allergies  Allergen Reactions  . Percocet [Oxycodone-Acetaminophen] Nausea Only  . Celebrex [Celecoxib] Rash   Family History  Problem Relation Age of Onset  . Arthritis Mother   . Cancer Father        prostate, esophygeal  . Heart disease Father        CHF ? etiology    Current Outpatient Medications (Endocrine & Metabolic):  .  testosterone cypionate (DEPOTESTOSTERONE CYPIONATE) 200 MG/ML injection, Inject 100 mg into the muscle every 7 (seven) days.    Current Outpatient Medications (Respiratory):  .  benzonatate (TESSALON) 100 MG capsule, Take 1 capsule (100 mg total) by mouth 2 (two) times daily as needed for cough.  Current Outpatient Medications (Analgesics):  .  HYDROcodone-acetaminophen (NORCO/VICODIN) 5-325 MG tablet, Take 1-2 tablets by mouth every  6 (six) hours as needed for moderate pain or severe pain. After lithotripsy .  ketorolac (TORADOL) 10 MG tablet, Take 1 tablet (10 mg total) by mouth every 6 (six) hours as needed for moderate pain. Marland Kitchen  ketorolac (TORADOL) 30 MG/ML injection, Inject 30 mg into the vein once.  Current Outpatient Medications (Hematological):  .  vitamin B-12 (CYANOCOBALAMIN) 1000 MCG tablet, Take 1,000 mcg by mouth daily.   Current Outpatient Medications (Other):  Marland Kitchen  Cholecalciferol (VITAMIN D3) 1000 UNITS CAPS, Take 1,000 capsules by mouth daily. Reported on 09/08/2015 .  Lactobacillus (ACIDOPHILUS PROBIOTIC) 10 MG TABS, Take 2 tablets by  mouth daily. .  ondansetron (ZOFRAN ODT) 4 MG disintegrating tablet, Take 1 tablet (4 mg total) by mouth every 8 (eight) hours as needed for nausea or vomiting. Marland Kitchen  scopolamine (TRANSDERM-SCOP, 1.5 MG,) 1 MG/3DAYS, Place 1 patch (1.5 mg total) onto the skin every 3 (three) days. .  tamsulosin (FLOMAX) 0.4 MG CAPS capsule, Take 1 capsule (0.4 mg total) by mouth daily after supper.   Reviewed prior external information including notes and imaging from  primary care provider As well as notes that were available from care everywhere and other healthcare systems.  Past medical history, social, surgical and family history all reviewed in electronic medical record.  No pertanent information unless stated regarding to the chief complaint.   Review of Systems:  No headache, visual changes, nausea, vomiting, diarrhea, constipation, dizziness, abdominal pain, skin rash, fevers, chills, night sweats, weight loss, swollen lymph nodes, body aches, joint swelling, chest pain, shortness of breath, mood changes. POSITIVE muscle aches  Objective  Blood pressure 130/80, pulse 65, height 5\' 10"  (1.778 m), weight 190 lb (86.2 kg), SpO2 99 %.   General: No apparent distress alert and oriented x3 mood and affect normal, dressed appropriately.  HEENT: Pupils equal, extraocular movements intact  Respiratory: Patient's speak in full sentences and does not appear short of breath  Cardiovascular: No lower extremity edema, non tender, no erythema  Gait mild antalgic MSK: Right knee exam does have some tenderness over the medial joint space.  Patient does have positive McMurray's and did have locking noted today with full flexion of the knee.  Patient does have a trace effusion which is new as well.    Impression and Recommendations:     The above documentation has been reviewed and is accurate and complete , DO

## 2020-09-27 ENCOUNTER — Encounter: Payer: Self-pay | Admitting: Family Medicine

## 2020-09-27 ENCOUNTER — Ambulatory Visit: Payer: BC Managed Care – PPO | Admitting: Family Medicine

## 2020-09-27 ENCOUNTER — Other Ambulatory Visit: Payer: Self-pay

## 2020-09-27 VITALS — BP 130/80 | HR 65 | Ht 70.0 in | Wt 190.0 lb

## 2020-09-27 DIAGNOSIS — G8929 Other chronic pain: Secondary | ICD-10-CM | POA: Diagnosis not present

## 2020-09-27 DIAGNOSIS — M25561 Pain in right knee: Secondary | ICD-10-CM

## 2020-09-27 DIAGNOSIS — S83241D Other tear of medial meniscus, current injury, right knee, subsequent encounter: Secondary | ICD-10-CM

## 2020-09-27 NOTE — Assessment & Plan Note (Signed)
Patient on exam today is having worsening.  Did not have a lot of pain on exam.  Patient has limited range of motion.  Patient is unable to workout or do anything at this point.  Patient is concerned about potential falling.  At this point I do feel an MRI is necessary due to the locking.  Patient could be a potential surgical candidate.  Follow-up with me again after imaging to discuss further.

## 2020-09-27 NOTE — Patient Instructions (Addendum)
Good to see you Mri of the right knee We will write you once we get results

## 2020-10-04 ENCOUNTER — Other Ambulatory Visit: Payer: Self-pay

## 2020-10-04 ENCOUNTER — Ambulatory Visit
Admission: RE | Admit: 2020-10-04 | Discharge: 2020-10-04 | Disposition: A | Discharge status: Home / Self Care | Payer: BC Managed Care – PPO | Source: Ambulatory Visit | Attending: Family Medicine | Admitting: Family Medicine

## 2020-10-04 DIAGNOSIS — G8929 Other chronic pain: Secondary | ICD-10-CM

## 2020-10-06 ENCOUNTER — Encounter: Payer: Self-pay | Admitting: Family Medicine

## 2020-10-12 ENCOUNTER — Other Ambulatory Visit: Payer: BC Managed Care – PPO

## 2021-02-14 ENCOUNTER — Encounter: Payer: Self-pay | Admitting: Cardiovascular Disease

## 2021-02-14 ENCOUNTER — Other Ambulatory Visit: Payer: Self-pay

## 2021-02-14 ENCOUNTER — Telehealth: Payer: Self-pay | Admitting: Cardiovascular Disease

## 2021-02-14 ENCOUNTER — Ambulatory Visit: Payer: BC Managed Care – PPO | Admitting: Cardiovascular Disease

## 2021-02-14 VITALS — BP 120/86 | HR 60 | Ht 72.0 in | Wt 185.0 lb

## 2021-02-14 DIAGNOSIS — R030 Elevated blood-pressure reading, without diagnosis of hypertension: Secondary | ICD-10-CM

## 2021-02-14 NOTE — Progress Notes (Signed)
Cardiology Office Note:    Date:  02/14/2021   ID:  Devon Carter, DOB April 20, 1971, MRN [id number]  PCP:  Devon Covey, MD   [facility] Providers Cardiologist:  None     Referring MD: Devon Covey, MD   Chief Complaint  Patient presents with   Hypertension    History of Present Illness:    Devon Carter is a 50 y.o. male with a hx of kidney stones, BP is mildly elevated  Eats salted peanuts Drinks wine most nights  Exercises regularly   Needs R A/C joint repair ( shave down down bone  spurs )  Exercises regularly  Runs 2 miles regularly   No CP , no dyspnea  Lipids from 2014 look ok     Past Medical History:  Diagnosis Date   Chronic kidney disease    kidney stones   GERD (gastroesophageal reflux disease)    History of kidney stones    Seizures (HCC)    grand mal, age 39  No medication since age 33 and no recurrent seizure.   Vitamin B 12 deficiency    Vitamin D deficiency disease     Past Surgical History:  Procedure Laterality Date   APPENDECTOMY  1994   EXTRACORPOREAL SHOCK WAVE LITHOTRIPSY Left 02/15/2019   Procedure: EXTRACORPOREAL SHOCK WAVE LITHOTRIPSY (ESWL);  Surgeon: Devon Ache, MD;  Location: [facility];  Service: Urology;  Laterality: Left;   HERNIA REPAIR  2002, 2010   left shoulder surgery      Current Medications: Current Meds  Medication Sig   benzonatate (TESSALON) 100 MG capsule Take 1 capsule (100 mg total) by mouth 2 (two) times daily as needed for cough.   Cholecalciferol (VITAMIN D3) 1000 UNITS CAPS Take 1,000 capsules by mouth daily. Reported on 09/08/2015   DHEA 10 MG TABS Take 10-47 mg by mouth daily.   ergocalciferol (VITAMIN D2) 1.25 MG (50000 UT) capsule Take by mouth.   famotidine (PEPCID) 20 MG tablet Take 20 mg by mouth daily.   HYDROcodone-acetaminophen (NORCO/VICODIN) 5-325 MG tablet Take 1-2 tablets by mouth every 6 (six) hours as needed for moderate pain or severe pain. After  lithotripsy   ketorolac (TORADOL) 10 MG tablet Take 1 tablet (10 mg total) by mouth every 6 (six) hours as needed for moderate pain.   ketorolac (TORADOL) 30 MG/ML injection Inject 30 mg into the vein once.   Lactobacillus (ACIDOPHILUS PROBIOTIC) 10 MG TABS Take 2 tablets by mouth daily.   Lactobacillus Rhamnosus, GG, (CULTURELLE KIDS) CHEW    ondansetron (ZOFRAN ODT) 4 MG disintegrating tablet Take 1 tablet (4 mg total) by mouth every 8 (eight) hours as needed for nausea or vomiting.   scopolamine (TRANSDERM-SCOP, 1.5 MG,) 1 MG/3DAYS Place 1 patch (1.5 mg total) onto the skin every 3 (three) days.   tamsulosin (FLOMAX) 0.4 MG CAPS capsule Take 1 capsule (0.4 mg total) by mouth daily after supper.   testosterone cypionate (DEPOTESTOSTERONE CYPIONATE) 200 MG/ML injection Inject 100 mg into the muscle every 7 (seven) days.    vitamin B-12 (CYANOCOBALAMIN) 1000 MCG tablet Take 1,000 mcg by mouth daily.      Allergies:   Percocet [oxycodone-acetaminophen] and Celebrex [celecoxib]   Social History   Socioeconomic History   Marital status: Married    Spouse name: Not on file   Number of children: Not on file   Years of education: Not on file   Highest education level: Not on file  Occupational History  Not on file  Tobacco Use   Smoking status: Never   Smokeless tobacco: Never  Vaping Use   Vaping Use: Never used  Substance and Sexual Activity   Alcohol use: Not on file   Drug use: Never   Sexual activity: Not on file  Other Topics Concern   Not on file  Social History Narrative   Not on file   Social Determinants of Health   Financial Resource Strain: Not on file  Food Insecurity: Not on file  Transportation Needs: Not on file  Physical Activity: Not on file  Stress: Not on file  Social Connections: Not on file     Family History: The patient's family history includes Arthritis in his mother; Cancer in his father; Heart disease in his father.  ROS:   Please see the  history of present illness.     All other systems reviewed and are negative.  EKGs/Labs/Other Studies Reviewed:    The following studies were reviewed today:   EKG: February 14, 2021: Normal sinus rhythm at 60.  He has nonspecific ST and T wave changes.  Recent Labs: No results found for requested labs within last 8760 hours.  Recent Lipid Panel    Component Value Date/Time   CHOL 177 10/08/2012 0831   TRIG 67.0 10/08/2012 0831   HDL 46.80 10/08/2012 0831   CHOLHDL 4 10/08/2012 0831   VLDL 13.4 10/08/2012 0831   LDLCALC 117 (H) 10/08/2012 0831     Risk Assessment/Calculations:           Physical Exam:    VS:  BP 120/86   Pulse 60   Ht 6' (1.829 m)   Wt 185 lb (83.9 kg)   SpO2 97%   BMI 25.09 kg/m     Wt Readings from Last 3 Encounters:  02/14/21 185 lb (83.9 kg)  09/27/20 190 lb (86.2 kg)  08/29/20 191 lb (86.6 kg)     GEN:  Well nourished, well developed in no acute distress HEENT: Normal NECK: No JVD; No carotid bruits LYMPHATICS: No lymphadenopathy CARDIAC: RRR, no murmurs, rubs, gallops RESPIRATORY:  Clear to auscultation without rales, wheezing or rhonchi  ABDOMEN: Soft, non-tender, non-distended MUSCULOSKELETAL:  No edema; No deformity  SKIN: Warm and dry NEUROLOGIC:  Alert and oriented x 3 PSYCHIATRIC:  Normal affect   ASSESSMENT:    1. Elevated BP without diagnosis of hypertension    PLAN:       Essential HTN:   BP is borderline elevated.   120 / 85 here in the office He eats a bit of additional salt ( mixed nuts)  Have recommended that he resume your cardio exercise   We will have him continue to record and monitor his blood pressure.  He will moderate his salt intake.  Resume his cardio exercise.  We will recheck him as needed.  2.  Right shoulder injury: He is scheduled to have repair of his AC joint next week.  This is scheduled as an outpatient surgery.  Devon Carter is at very low risk from a cardiovascular standpoint and does not need any  further cardiac evaluation prior to his upcoming surgery next Friday.       Medication Adjustments/Labs and Tests Ordered: Current medicines are reviewed at length with the patient today.  Concerns regarding medicines are outlined above.  Orders Placed This Encounter  Procedures   EKG 12-Lead    No orders of the defined types were placed in this encounter.   Patient Instructions  Medication Instructions:  Your physician recommends that you continue on your current medications as directed. Please refer to the Current Medication list given to you today.  *If you need a refill on your cardiac medications before your next appointment, please call your pharmacy*   Lab Work: None Ordered If you have labs (blood work) drawn today and your tests are completely normal, you will receive your results only by: MyChart Message (if you have MyChart) OR A paper copy in the mail If you have any lab test that is abnormal or we need to change your treatment, we will call you to review the results.   Testing/Procedures: None Ordered   Follow-Up: At [facility], you and your health needs are our priority.  As part of our continuing mission to provide you with exceptional heart care, we have created designated Provider Care Teams.  These Care Teams include your primary Cardiologist (physician) and Advanced Practice Providers (APPs -  Physician Assistants and Nurse Practitioners) who all work together to provide you with the care you need, when you need it.   Your next appointment:     As needed  The format for your next appointment:   In Person  Provider:   You may see Kristeen Miss, MD or one of the following Advanced Practice Providers on your designated Care Team:   Tereso Newcomer, PA-C [place], [place]    Signed, Kristeen Miss, MD  02/14/2021 3:05 PM    [facility]

## 2021-02-14 NOTE — Patient Instructions (Signed)
Medication Instructions:  Your physician recommends that you continue on your current medications as directed. Please refer to the Current Medication list given to you today.  *If you need a refill on your cardiac medications before your next appointment, please call your pharmacy*   Lab Work: None Ordered If you have labs (blood work) drawn today and your tests are completely normal, you will receive your results only by: MyChart Message (if you have MyChart) OR A paper copy in the mail If you have any lab test that is abnormal or we need to change your treatment, we will call you to review the results.   Testing/Procedures: None Ordered   Follow-Up: At [facility], you and your health needs are our priority.  As part of our continuing mission to provide you with exceptional heart care, we have created designated Provider Care Teams.  These Care Teams include your primary Cardiologist (physician) and Advanced Practice Providers (APPs -  Physician Assistants and Nurse Practitioners) who all work together to provide you with the care you need, when you need it.   Your next appointment:     As needed  The format for your next appointment:   In Person  Provider:   You may see Kristeen Miss, MD or one of the following Advanced Practice Providers on your designated Care Team:   Tereso Newcomer, PA-C [place], [place]

## 2021-02-14 NOTE — Telephone Encounter (Signed)
Per Dr. Elease Hashimoto- ok to add pt to his schedule today at 2pm for high blood pressure readings. Appt scheduled.

## 2021-05-20 ENCOUNTER — Other Ambulatory Visit: Payer: Self-pay | Admitting: Cardiovascular Disease

## 2021-05-20 MED ORDER — METHOCARBAMOL 500 MG PO TABS: 1000.0000 mg | ORAL_TABLET | Freq: Four times a day (QID) | ORAL | 6 refills | Status: DC | PRN

## 2021-07-31 ENCOUNTER — Other Ambulatory Visit: Payer: Self-pay | Admitting: Cardiovascular Disease

## 2021-07-31 MED ORDER — ALBUTEROL SULFATE HFA 108 (90 BASE) MCG/ACT IN AERS: 2.0000 | INHALATION_SPRAY | Freq: Four times a day (QID) | RESPIRATORY_TRACT | 6 refills | Status: AC | PRN

## 2022-02-03 ENCOUNTER — Other Ambulatory Visit: Payer: Self-pay | Admitting: Cardiovascular Disease

## 2022-02-03 MED ORDER — METHOCARBAMOL 500 MG PO TABS
1000.0000 mg | ORAL_TABLET | Freq: Four times a day (QID) | ORAL | 6 refills | Status: DC | PRN
Start: 2022-02-03 — End: 2023-02-15

## 2022-05-28 ENCOUNTER — Telehealth: Payer: Self-pay | Admitting: Cardiovascular Disease

## 2022-05-28 DIAGNOSIS — E782 Mixed hyperlipidemia: Secondary | ICD-10-CM

## 2022-05-28 NOTE — Addendum Note (Signed)
Addended by: Lars Mage on: 05/28/2022 03:06 PM   Modules accepted: Orders

## 2022-05-28 NOTE — Telephone Encounter (Signed)
Per verbal order of Nahser: Order patient Coronary calcium score CT NMR Lipo(a) ApoB CMET OV to follow-up after results.  Orders placed at this time.

## 2022-05-31 ENCOUNTER — Other Ambulatory Visit (HOSPITAL_BASED_OUTPATIENT_CLINIC_OR_DEPARTMENT_OTHER): Payer: BC Managed Care – PPO

## 2022-06-03 ENCOUNTER — Ambulatory Visit (HOSPITAL_BASED_OUTPATIENT_CLINIC_OR_DEPARTMENT_OTHER)
Admission: RE | Admit: 2022-06-03 | Discharge: 2022-06-03 | Disposition: A | Discharge status: Home / Self Care | Payer: BC Managed Care – PPO | Source: Ambulatory Visit | Attending: Cardiovascular Disease | Admitting: Cardiovascular Disease

## 2022-06-03 DIAGNOSIS — E782 Mixed hyperlipidemia: Secondary | ICD-10-CM | POA: Insufficient documentation

## 2022-06-05 ENCOUNTER — Ambulatory Visit: Payer: BC Managed Care – PPO | Attending: Cardiovascular Disease

## 2022-06-05 DIAGNOSIS — E782 Mixed hyperlipidemia: Secondary | ICD-10-CM

## 2022-06-06 LAB — NMR, LIPOPROFILE
Cholesterol, Total: 116 mg/dL (ref 100–199)
HDL Particle Number: 41.4 umol/L (ref >=30.5)
HDL-C: 66 mg/dL (ref >39)
LDL Particle Number: 318 nmol/L (ref <1000)
LDL-C (NIH Calc): 36 mg/dL (ref 0–99)
LP-IR Score: <25 (ref <=45)
Small LDL Particle Number: <90 nmol/L (ref <=527)
Triglycerides: 63 mg/dL (ref 0–149)

## 2022-06-06 LAB — COMPREHENSIVE METABOLIC PANEL
ALT: 22 IU/L (ref 0–44)
AST: 23 IU/L (ref 0–40)
Albumin/Globulin Ratio: 1.8 (ref 1.2–2.2)
Albumin: 4.6 g/dL (ref 4.1–5.1)
Alkaline Phosphatase: 87 IU/L (ref 44–121)
BUN/Creatinine Ratio: 14 (ref 9–20)
BUN: 18 mg/dL (ref 6–24)
Bilirubin Total: 0.6 mg/dL (ref 0.0–1.2)
CO2: 27 mmol/L (ref 20–29)
Calcium: 9.5 mg/dL (ref 8.7–10.2)
Chloride: 104 mmol/L (ref 96–106)
Creatinine, Ser: 1.27 mg/dL (ref 0.76–1.27)
Globulin, Total: 2.5 g/dL (ref 1.5–4.5)
Glucose: 93 mg/dL (ref 70–99)
Potassium: 4.7 mmol/L (ref 3.5–5.2)
Sodium: 144 mmol/L (ref 134–144)
Total Protein: 7.1 g/dL (ref 6.0–8.5)
eGFR: 69 mL/min/{1.73_m2} (ref >59)

## 2022-06-06 LAB — APOLIPOPROTEIN B: Apolipoprotein B: 100 mg/dL — ABNORMAL HIGH (ref <90)

## 2022-06-06 LAB — LIPOPROTEIN A (LPA): Lipoprotein (a): 87.9 nmol/L — ABNORMAL HIGH (ref <75.0)

## 2022-11-20 ENCOUNTER — Ambulatory Visit (INDEPENDENT_AMBULATORY_CARE_PROVIDER_SITE_OTHER): Payer: BC Managed Care – PPO | Admitting: Family Medicine

## 2022-11-20 ENCOUNTER — Encounter: Payer: Self-pay | Admitting: Family Medicine

## 2022-11-20 ENCOUNTER — Ambulatory Visit (INDEPENDENT_AMBULATORY_CARE_PROVIDER_SITE_OTHER): Payer: BC Managed Care – PPO

## 2022-11-20 VITALS — BP 124/80 | HR 64 | Temp 98.6°F | Wt 188.0 lb

## 2022-11-20 DIAGNOSIS — J189 Pneumonia, unspecified organism: Secondary | ICD-10-CM | POA: Diagnosis not present

## 2022-11-20 DIAGNOSIS — R051 Acute cough: Secondary | ICD-10-CM

## 2022-11-20 MED ORDER — AMOXICILLIN-POT CLAVULANATE 875-125 MG PO TABS: 1.0000 | ORAL_TABLET | Freq: Two times a day (BID) | ORAL | 0 refills | Status: DC

## 2022-11-20 NOTE — Progress Notes (Signed)
    Subjective:    Patient ID: Devon Carter, male    DOB: 02-Jan-1971, 52 y.o.   MRN: [id number]  HPI Here for 11 days of coughing up yellow sputum. At the beginning he had head congestion, PND, and a ST, but these resolved after a few days. He denies any chest pain or fever or SOB, but he is very fatigued. Taking Mucinex .   Review of Systems  Constitutional:  Positive for fatigue. Negative for fever.  HENT: Negative.    Eyes: Negative.   Respiratory:  Positive for cough. Negative for shortness of breath and wheezing.        Objective:   Physical Exam Constitutional:      Appearance: Normal appearance.  HENT:     Right Ear: Tympanic membrane, ear canal and external ear normal.     Left Ear: Tympanic membrane, ear canal and external ear normal.     Nose: Nose normal.     Mouth/Throat:     Pharynx: Oropharynx is clear.  Eyes:     Conjunctiva/sclera: Conjunctivae normal.  Cardiovascular:     Rate and Rhythm: Normal rate and regular rhythm.     Pulses: Normal pulses.     Heart sounds: Normal heart sounds.  Pulmonary:     Effort: Pulmonary effort is normal. No respiratory distress.     Breath sounds: Rales present. No wheezing or rhonchi.     Comments: There are rales at the right base  Lymphadenopathy:     Cervical: No cervical adenopathy.  Neurological:     Mental Status: He is alert.           Assessment & Plan:  RLL pneumonia. Treat with 10 days of Augmentin. Get a CXR today.  Gershon Crane, MD

## 2023-02-15 ENCOUNTER — Other Ambulatory Visit: Payer: Self-pay | Admitting: Cardiovascular Disease

## 2023-02-15 MED ORDER — METHOCARBAMOL 500 MG PO TABS: 1000.0000 mg | ORAL_TABLET | Freq: Four times a day (QID) | ORAL | 6 refills | Status: DC | PRN

## 2023-06-26 ENCOUNTER — Other Ambulatory Visit: Payer: Self-pay | Admitting: Cardiovascular Disease

## 2023-07-25 ENCOUNTER — Ambulatory Visit (INDEPENDENT_AMBULATORY_CARE_PROVIDER_SITE_OTHER): Payer: BC Managed Care – PPO | Admitting: Family Medicine

## 2023-07-25 ENCOUNTER — Encounter: Payer: Self-pay | Admitting: Family Medicine

## 2023-07-25 ENCOUNTER — Telehealth: Payer: Self-pay

## 2023-07-25 VITALS — BP 122/82 | HR 65 | Temp 98.0°F | Ht 71.65 in | Wt 190.3 lb

## 2023-07-25 DIAGNOSIS — Z23 Encounter for immunization: Secondary | ICD-10-CM | POA: Diagnosis not present

## 2023-07-25 DIAGNOSIS — Z0000 Encounter for general adult medical examination without abnormal findings: Secondary | ICD-10-CM | POA: Diagnosis not present

## 2023-07-25 DIAGNOSIS — R7989 Other specified abnormal findings of blood chemistry: Secondary | ICD-10-CM | POA: Insufficient documentation

## 2023-07-25 DIAGNOSIS — Z1159 Encounter for screening for other viral diseases: Secondary | ICD-10-CM

## 2023-07-25 LAB — LIPID PANEL
Cholesterol: 179 mg/dL (ref 0–200)
HDL: 49.1 mg/dL (ref >39.00)
LDL Cholesterol: 114 mg/dL — ABNORMAL HIGH (ref 0–99)
NonHDL: 129.65
Total CHOL/HDL Ratio: 4
Triglycerides: 76 mg/dL (ref 0.0–149.0)
VLDL: 15.2 mg/dL (ref 0.0–40.0)

## 2023-07-25 LAB — CBC WITH DIFFERENTIAL/PLATELET
Basophils Absolute: 0 10*3/uL (ref 0.0–0.1)
Basophils Relative: 0.3 % (ref 0.0–3.0)
Eosinophils Absolute: 0.2 10*3/uL (ref 0.0–0.7)
Eosinophils Relative: 3.3 % (ref 0.0–5.0)
HCT: 53.4 % — ABNORMAL HIGH (ref 39.0–52.0)
Hemoglobin: 18.2 g/dL (ref 13.0–17.0)
Lymphocytes Relative: 29.9 % (ref 12.0–46.0)
Lymphs Abs: 1.6 10*3/uL (ref 0.7–4.0)
MCHC: 34.1 g/dL (ref 30.0–36.0)
MCV: 91.5 fL (ref 78.0–100.0)
Monocytes Absolute: 0.6 10*3/uL (ref 0.1–1.0)
Monocytes Relative: 11.1 % (ref 3.0–12.0)
Neutro Abs: 2.9 10*3/uL (ref 1.4–7.7)
Neutrophils Relative %: 55.4 % (ref 43.0–77.0)
Platelets: 190 10*3/uL (ref 150.0–400.0)
RBC: 5.84 Mil/uL — ABNORMAL HIGH (ref 4.22–5.81)
RDW: 12.9 % (ref 11.5–15.5)
WBC: 5.3 10*3/uL (ref 4.0–10.5)

## 2023-07-25 LAB — BASIC METABOLIC PANEL
BUN: 19 mg/dL (ref 6–23)
CO2: 30 meq/L (ref 19–32)
Calcium: 9.6 mg/dL (ref 8.4–10.5)
Chloride: 103 meq/L (ref 96–112)
Creatinine, Ser: 1.3 mg/dL (ref 0.40–1.50)
GFR: 63.78 mL/min (ref >60.00)
Glucose, Bld: 101 mg/dL — ABNORMAL HIGH (ref 70–99)
Potassium: 4.8 meq/L (ref 3.5–5.1)
Sodium: 139 meq/L (ref 135–145)

## 2023-07-25 LAB — HEPATIC FUNCTION PANEL
ALT: 23 U/L (ref 0–53)
AST: 30 U/L (ref 0–37)
Albumin: 4.7 g/dL (ref 3.5–5.2)
Alkaline Phosphatase: 70 U/L (ref 39–117)
Bilirubin, Direct: 0.1 mg/dL (ref 0.0–0.3)
Total Bilirubin: 0.7 mg/dL (ref 0.2–1.2)
Total Protein: 7 g/dL (ref 6.0–8.3)

## 2023-07-25 LAB — PSA: PSA: 1.36 ng/mL (ref 0.10–4.00)

## 2023-07-25 NOTE — Telephone Encounter (Signed)
 Noted- suspect related to his testosterone therapy (where he gets elsewhere)    pt will be notified.  Devon Covey MD [facility]

## 2023-07-25 NOTE — Patient Instructions (Signed)
Consider shingrix vaccine at some point this year.  

## 2023-07-25 NOTE — Progress Notes (Signed)
 Established Patient Office Visit  Subjective   Patient ID: Devon Carter, male    DOB: 1971-06-30  Age: 53 y.o. MRN: [id number]  No chief complaint on file.   HPI   [place] is here for physical exam.  Generally fairly healthy.  He does have history of some GERD symptoms intermittently managed with over-the-counter Pepcid .  He also has history of kidney stones and has had prior lithotripsy but no recent stones.  He does have history of hypogonadism and is on testosterone  therapy per [facility] His wife is a publishing rights manager and administers his testosterone  which is weekly.  He does get some lab monitoring through integrative health.  Takes no other regular medications.  Health maintenance reviewed:  -Had colonoscopy 2021 with recommended 10-year follow-up -Flu vaccine already given -No history of Shingrix vaccine -Last tetanus 2014 -No history of documented hepatitis C screening but low risk  Family history-'s father had coronary disease around age 69 and also esophageal cancer.  Died of congestive heart failure complications.  He also had a paternal grandfather that died age 16 of possibly MI.  Mother had rheumatoid arthritis and died age 85 reportedly of sepsis from MRSA.  Also had a maternal grandmother with rheumatoid arthritis.  He has a sister with type 2 diabetes.  Social history-married with 2 children.  His daughter attends [employer].  Son graduated from [employer].  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  He is actually taking over existing business.  Past Medical History:  Diagnosis Date   Chronic kidney disease    kidney stones   GERD (gastroesophageal reflux disease)    History of kidney stones    Seizures (HCC)    grand mal, age 27  No medication since age 33 and no recurrent seizure.   Vitamin B 12 deficiency    Vitamin D  deficiency disease     Past Surgical History:  Procedure Laterality Date   APPENDECTOMY  1994   EXTRACORPOREAL SHOCK WAVE LITHOTRIPSY Left 02/15/2019   Procedure: EXTRACORPOREAL SHOCK WAVE LITHOTRIPSY (ESWL);  Surgeon: Alvaro Hummer, MD;  Location: [facility];  Service: Urology;  Laterality: Left;   HERNIA REPAIR  2002, 2010   left shoulder surgery      reports that he has never smoked. He has never used smokeless tobacco. He reports that he does not use drugs. No history on file for alcohol use. family history includes Arthritis in his mother; Cancer in his father; Heart disease in his father. Allergies  Allergen Reactions   Percocet [Oxycodone-Acetaminophen ] Nausea Only   Celebrex [Celecoxib] Rash    Review of Systems  Constitutional:  Negative for chills, fever, malaise/fatigue and weight loss.  HENT:  Negative for hearing loss.   Eyes:  Negative for blurred vision and double vision.  Respiratory:  Negative for cough and shortness of breath.   Cardiovascular:  Negative for chest pain, palpitations and leg swelling.  Gastrointestinal:  Negative for abdominal pain, blood in stool, constipation and diarrhea.  Genitourinary:  Negative for dysuria.  Skin:  Negative for rash.  Neurological:  Negative for dizziness, speech change, seizures, loss of consciousness and headaches.  Psychiatric/Behavioral:  Negative for depression.       Objective:     BP 122/82 (BP Location: Left Arm, Cuff Size: Normal)   Pulse 65   Temp 98 F (36.7 C) (Oral)   Ht 5' 11.65 (1.82  m)   Wt 190 lb 4.8 oz (86.3 kg)   SpO2 97%   BMI 26.06 kg/m  BP Readings from Last 3 Encounters:  07/25/23 122/82  11/20/22 124/80  02/14/21 120/86   Wt Readings from Last 3 Encounters:  07/25/23 190 lb 4.8 oz (86.3 kg)  11/20/22 188 lb (85.3 kg)  02/14/21 185 lb (83.9 kg)      Physical Exam Vitals reviewed.  Constitutional:      General: He is not in acute distress.    Appearance: He is well-developed.  HENT:     Head:  Normocephalic and atraumatic.     Ears:     Comments: Does have some cerumen in ear canals right greater than left Eyes:     Pupils: Pupils are equal, round, and reactive to light.  Neck:     Thyroid : No thyromegaly.  Cardiovascular:     Rate and Rhythm: Normal rate and regular rhythm.     Heart sounds: Normal heart sounds. No murmur heard. Pulmonary:     Effort: No respiratory distress.     Breath sounds: No wheezing or rales.  Abdominal:     General: There is no distension.     Palpations: Abdomen is soft. There is no mass.     Tenderness: There is no abdominal tenderness. There is no guarding or rebound.  Musculoskeletal:     Cervical back: Normal range of motion and neck supple.     Right lower leg: No edema.     Left lower leg: No edema.  Lymphadenopathy:     Cervical: No cervical adenopathy.  Skin:    Findings: No rash.  Neurological:     Mental Status: He is alert and oriented to person, place, and time.     Cranial Nerves: No cranial nerve deficit.      No results found for any visits on 07/25/23.    The ASCVD Risk score (Arnett DK, et al., 2019) failed to calculate for the following reasons:   Cannot find a previous HDL lab   The valid total cholesterol range is 130 to 320 mg/dL    Assessment & Plan:   Problem List Items Addressed This Visit   None Visit Diagnoses       Physical exam    -  Primary   Relevant Orders   Basic metabolic panel   Lipid panel   CBC with Differential/Platelet   Hepatic function panel   PSA     Encounter for hepatitis C screening test for low risk patient       Relevant Orders   Hep C Antibody     Generally healthy 53 year old male.  History of low testosterone  treated through [facility].  He has history of intermittent GERD symptoms controlled with Pepcid .  We discussed following health maintenance items:  -Flu vaccine already given -Recommend tetanus booster and patient consents -We did discuss Shingrix  vaccine.  He declines at this time but will consider at some point this year -Check hepatitis C antibody (low risk) -Continue regular exercise habits. -Obtain follow-up labs as above -He had coronary calcium score of 0 back in 2023.  No follow-ups on file.    Wolm Scarlet, MD

## 2023-07-25 NOTE — Telephone Encounter (Signed)
 CRITICAL VALUE STICKER  CRITICAL VALUE: Hemoglobin 18.2  RECEIVER (on-site recipient of call): Brinkley Peet G   DATE & TIME NOTIFIED: 10:47   MESSENGER (representative from lab): Darice Harlem  MD NOTIFIED:  Via epic and verbal   TIME OF NOTIFICATION: 10:47 am   RESPONSE: N/A

## 2023-07-26 LAB — HEPATITIS C ANTIBODY: Hepatitis C Ab: NONREACTIVE

## 2023-09-15 ENCOUNTER — Other Ambulatory Visit: Payer: Self-pay | Admitting: Cardiovascular Disease

## 2023-09-15 MED ORDER — METHOCARBAMOL 500 MG PO TABS: 1000.0000 mg | ORAL_TABLET | Freq: Four times a day (QID) | ORAL | 6 refills | Status: AC | PRN

## 2024-06-25 ENCOUNTER — Ambulatory Visit (INDEPENDENT_AMBULATORY_CARE_PROVIDER_SITE_OTHER): Admitting: Family Medicine

## 2024-06-25 ENCOUNTER — Encounter: Payer: Self-pay | Admitting: Family Medicine

## 2024-06-25 VITALS — BP 120/84 | HR 85 | Temp 97.8°F | Ht 71.0 in | Wt 198.0 lb

## 2024-06-25 DIAGNOSIS — Z87442 Personal history of urinary calculi: Secondary | ICD-10-CM

## 2024-06-25 DIAGNOSIS — R10A1 Flank pain, right side: Secondary | ICD-10-CM

## 2024-06-25 DIAGNOSIS — R109 Unspecified abdominal pain: Secondary | ICD-10-CM

## 2024-06-25 LAB — POCT URINALYSIS DIPSTICK
Bilirubin, UA: NEGATIVE
Blood, UA: POSITIVE
Glucose, UA: NEGATIVE
Ketones, UA: NEGATIVE
Leukocytes, UA: NEGATIVE
Nitrite, UA: NEGATIVE
Protein, UA: POSITIVE — AB
Spec Grav, UA: 1.015 (ref 1.010–1.025)
Urobilinogen, UA: 0.2 U/dL
pH, UA: 6 (ref 5.0–8.0)

## 2024-06-25 MED ORDER — HYDROCODONE-ACETAMINOPHEN 5-325 MG PO TABS: 1.0000 | ORAL_TABLET | Freq: Four times a day (QID) | ORAL | 0 refills | Status: AC | PRN

## 2024-06-25 MED ORDER — KETOROLAC TROMETHAMINE 60 MG/2ML IM SOLN
60.0000 mg | Freq: Once | INTRAMUSCULAR | Status: AC
  Administered 2024-06-25: 60 mg via INTRAMUSCULAR

## 2024-06-25 MED ORDER — HYDROCODONE-ACETAMINOPHEN 5-325 MG PO TABS: 1.0000 | ORAL_TABLET | Freq: Four times a day (QID) | ORAL | 0 refills | Status: DC | PRN

## 2024-06-25 NOTE — Progress Notes (Signed)
 Acute Office Visit   Subjective:  Patient ID: Devon Carter, male    DOB: November 05, 1970, 53 y.o.   MRN: [id number]  Chief Complaint  Patient presents with   Back Pain    HPI:  Patient is present for an acute visit. Primary care is Dr. Wolm Ores.   Patient is having of right mid back pain that feels similar to previous kidney stone. He reports the pain started all of a sudden. Pain has improved, but dull pain. Stinging when urinating. Denies any hematuria. Denies nausea or vomiting. Denies abd pain. Denies fever.   He has a urologist, [facility] with Dr. Octaviano Fuse in [place]. Last seen a couple of years ago.   Review of Systems  Musculoskeletal:  Positive for back pain.   See HPI above      Objective:   BP 120/84   Pulse 85   Temp 97.8 F (36.6 C) (Oral)   Ht 5' 11 (1.803 m)   Wt 198 lb (89.8 kg)   SpO2 98%   BMI 27.62 kg/m    Physical Exam Vitals reviewed.  Constitutional:      General: He is not in acute distress.    Appearance: Normal appearance. He is not ill-appearing, toxic-appearing or diaphoretic.  HENT:     Head: Normocephalic and atraumatic.  Eyes:     General:        Right eye: No discharge.        Left eye: No discharge.     Conjunctiva/sclera: Conjunctivae normal.  Cardiovascular:     Rate and Rhythm: Normal rate and regular rhythm.     Heart sounds: Normal heart sounds. No murmur heard.    No friction rub. No gallop.  Pulmonary:     Effort: Pulmonary effort is normal. No respiratory distress.     Breath sounds: Normal breath sounds.  Abdominal:     General: There is no distension.     Palpations: There is no mass.     Tenderness: There is no abdominal tenderness. There is right CVA tenderness.  Musculoskeletal:        General: Normal range of motion.  Skin:    General: Skin is warm and dry.  Neurological:     General: No focal deficit present.     Mental Status: He is alert and oriented  to person, place, and time. Mental status is at baseline.  Psychiatric:        Mood and Affect: Mood normal.        Behavior: Behavior normal.        Thought Content: Thought content normal.        Judgment: Judgment normal.    Results for orders placed or performed in visit on 06/25/24  POC Urinalysis Dipstick  Result Value Ref Range   Color, UA dark yellow    Clarity, UA clear    Glucose, UA Negative Negative   Bilirubin, UA negative    Ketones, UA negative    Spec Grav, UA 1.015 1.010 - 1.025   Blood, UA positive    pH, UA 6.0 5.0 - 8.0   Protein, UA Positive (A) Negative   Urobilinogen, UA 0.2 0.2 or 1.0 E.U./dL   Nitrite, UA negative    Leukocytes, UA Negative Negative   Appearance     Odor          Assessment & Plan:  Right flank pain -     POCT urinalysis dipstick -  Ketorolac  Tromethamine  -     CT RENAL STONE STUDY; Future -     HYDROcodone -Acetaminophen ; Take 1 tablet by mouth every 6 (six) hours as needed for up to 5 days for moderate pain (pain score 4-6).  Dispense: 20 tablet; Refill: 0 -     Urine Culture  Abdominal pain, unspecified abdominal location -     CT RENAL STONE STUDY; Future -     HYDROcodone -Acetaminophen ; Take 1 tablet by mouth every 6 (six) hours as needed for up to 5 days for moderate pain (pain score 4-6).  Dispense: 20 tablet; Refill: 0  History of kidney stones -     CT RENAL STONE STUDY; Future -     HYDROcodone -Acetaminophen ; Take 1 tablet by mouth every 6 (six) hours as needed for up to 5 days for moderate pain (pain score 4-6).  Dispense: 20 tablet; Refill: 0   -Urinalysis completed today with blood and protein present. Will send urine for culture for confirmation of no infection. Office will call with results and will be available via MyChart. -Recommend to stay hydrated with water. -Provided Toradol  60mg  Intramuscular injection for pain since this has been beneficial in the past.  -Ordered a STAT CT renal stone study due to  symptoms and urinalysis. You should receive a phone call about scheduling in the next 1-2 hours. If not, please call the office back or send a MyChart message. -Prescribed Norco (Hydrocodone /Acetaminophen ) 5-325mg  tablet. Take 1 tablet every 6 hours as needed for moderate to severe pain. -If symptoms become worse recommend to go to the closes emergency department. -Discussed about prescribing Flomax , but declined since it has made symptoms worse in the past.    Analeise Mccleery, NP

## 2024-06-25 NOTE — Addendum Note (Signed)
 Addended by: Azalya Galyon R on: 06/25/2024 11:52 AM   Modules accepted: Level of Service

## 2024-06-25 NOTE — Patient Instructions (Addendum)
-  It was a pleasure to care for you today.  -Urinalysis completed today with blood and protein present. Will send urine for culture for confirmation of no infection. Office will call with results and will be available via MyChart. -Recommend to stay hydrated with water. -Provided Toradol  60mg  Intramuscular injection for pain since this has been beneficial in the past.  -Ordered a STAT CT renal stone study due to symptoms and urinalysis. You should receive a phone call about scheduling in the next 1-2 hours. If not, please call the office back or send a MyChart message. -Prescribed Norco (Hydrocodone /Acetaminophen ) 5-325mg  tablet. Take 1 tablet every 6 hours as needed for moderate to severe pain. -If symptoms become worse recommend to go to the closes emergency department. -Discussed about prescribing Flomax , but declined since it has made symptoms worse in the past.

## 2024-06-26 LAB — URINE CULTURE
MICRO NUMBER:: 17349428
Result:: NO GROWTH
SPECIMEN QUALITY:: ADEQUATE

## 2024-06-28 ENCOUNTER — Ambulatory Visit: Payer: Self-pay | Admitting: Family Medicine
